# Patient Record
Sex: Male | Born: 1950 | Race: White | Hispanic: No | Marital: Single | Smoking: Never smoker
Health system: Southern US, Community
[De-identification: ages and names within clinical notes are randomized; demographics above are authoritative.]

## PROBLEM LIST (undated history)

## (undated) DIAGNOSIS — I10 Essential (primary) hypertension: Secondary | ICD-10-CM

## (undated) DIAGNOSIS — C801 Malignant (primary) neoplasm, unspecified: Secondary | ICD-10-CM

---

## 1978-01-14 HISTORY — PX: CHOLECYSTECTOMY: SHX55

## 1988-01-15 HISTORY — PX: HERNIA REPAIR: SHX51

## 2002-06-15 ENCOUNTER — Ambulatory Visit (HOSPITAL_COMMUNITY): Admission: RE | Admit: 2002-06-15 | Discharge: 2002-06-15 | Payer: Self-pay | Admitting: Gastroenterology

## 2007-11-17 ENCOUNTER — Encounter: Admission: RE | Admit: 2007-11-17 | Discharge: 2007-11-17 | Payer: Self-pay | Admitting: Family Medicine

## 2010-06-01 NOTE — Op Note (Signed)
   NAMEMORGEN, RITACCO                             ACCOUNT NO.:  [id number]   MEDICAL RECORD NO.:  [id number]                   PATIENT TYPE:  AMB   LOCATION:  ENDO                                 FACILITY:  [facility]   PHYSICIAN:  James L. Malon Kindle., M.D.          DATE OF BIRTH:  1950/08/17   DATE OF PROCEDURE:  06/15/2002  DATE OF DISCHARGE:                                 OPERATIVE REPORT   PROCEDURE PERFORMED:  Colonoscopy.   PREMEDICATION:  Fentanyl 50 mcg, Versed 5 mg IV.   SCOPE:  Olympus pediatric colonoscope.   INDICATIONS FOR PROCEDURE:  Colon cancer screening.   DESCRIPTION OF PROCEDURE:  The procedure had been explained to the patient  and consent obtained.  With the patient in the left lateral decubitus  position, the Olympus pediatric adjustable colonoscope was inserted and  advanced.  We advanced easily to the cecum.  The ileocecal valve and  appendiceal orifice were seen.  The scope was withdrawn in the cecum and  ascending colon, hepatic flexure, transverse and descending colon were seen.  The sigmoid colon was seen as well.  No polyps were seen throughout.  The  scope was withdrawn down into the rectum and the rectum was free of polyps.  The scope was withdrawn.  The patient tolerated the procedure well.  There  were no immediate problems.   ASSESSMENT:  Normal screening colonoscopy.   PLAN:  Will recommend yearly Hemoccults and possibly repeating the  colonoscopy in 10 years.                                                James L. Malon Kindle., M.D.    Waldron Session  D:  06/15/2002  T:  06/15/2002  Job:  [id number]   cc:   Schuyler Amor, M.D.  [address]  [place], [place] [address]  Fax: [phone]

## 2014-12-16 ENCOUNTER — Other Ambulatory Visit: Payer: Self-pay | Admitting: Family Medicine

## 2014-12-16 DIAGNOSIS — Z0000 Encounter for general adult medical examination without abnormal findings: Secondary | ICD-10-CM

## 2014-12-16 DIAGNOSIS — Z136 Encounter for screening for cardiovascular disorders: Secondary | ICD-10-CM

## 2015-01-13 ENCOUNTER — Other Ambulatory Visit: Payer: Self-pay | Admitting: Family Medicine

## 2015-01-13 ENCOUNTER — Ambulatory Visit
Admission: RE | Admit: 2015-01-13 | Discharge: 2015-01-13 | Disposition: A | Discharge status: Home / Self Care | Payer: BLUE CROSS/BLUE SHIELD | Source: Ambulatory Visit | Attending: Family Medicine | Admitting: Family Medicine

## 2015-01-13 DIAGNOSIS — Z136 Encounter for screening for cardiovascular disorders: Secondary | ICD-10-CM

## 2015-01-13 DIAGNOSIS — Z0000 Encounter for general adult medical examination without abnormal findings: Secondary | ICD-10-CM

## 2016-04-08 ENCOUNTER — Ambulatory Visit (INDEPENDENT_AMBULATORY_CARE_PROVIDER_SITE_OTHER): Payer: Worker's Compensation | Admitting: Family Medicine

## 2016-04-08 ENCOUNTER — Ambulatory Visit (INDEPENDENT_AMBULATORY_CARE_PROVIDER_SITE_OTHER): Payer: Worker's Compensation

## 2016-04-08 VITALS — BP 147/88 | HR 70 | Temp 97.9°F | Resp 18 | Ht 66.0 in | Wt 154.2 lb

## 2016-04-08 DIAGNOSIS — S0990XA Unspecified injury of head, initial encounter: Secondary | ICD-10-CM | POA: Diagnosis not present

## 2016-04-08 DIAGNOSIS — S0101XA Laceration without foreign body of scalp, initial encounter: Secondary | ICD-10-CM

## 2016-04-08 NOTE — Progress Notes (Signed)
Christian Oneal Nov 05, 1950 66 y.o.   Chief Complaint  Patient presents with  . Head Injury    X 2:30 today- WC    Presents for evaluation of work-related complaint.  Date of Injury: 04/08/2016  History of Present Illness: Patient reports that today at work he was replacing tools in the tool box The lid of the tool box was open and the wind blew and the lid of the tool fell down on his head He reports that it is a six foot long tool box with a heavy lid weighing about 25 pounds He denies any loss of consciousness He reports that it hit the top of his head closer to the left temple that created a scalp laceration He reports that he applied pressure to the area and it stopped bleeding He also hit his face on the right side     Review of Systems  Constitutional: Negative for chills, fever and weight loss.  HENT: Negative for hearing loss and tinnitus.   Eyes: Negative for blurred vision, double vision, photophobia and pain.  Respiratory: Negative for cough, shortness of breath and wheezing.   Cardiovascular: Negative for chest pain, palpitations and leg swelling.  Gastrointestinal: Negative for abdominal pain, blood in stool, constipation, diarrhea, nausea and vomiting.  Genitourinary: Negative for dysuria, frequency and urgency.  Neurological: Positive for headaches. Negative for dizziness, tingling, tremors, sensory change, speech change, focal weakness, seizures and loss of consciousness.  Psychiatric/Behavioral: Negative for depression and suicidal ideas. The patient is not nervous/anxious.        Current medications and allergies reviewed and updated. Past medical history, family history, social history have been reviewed and updated.   Physical Exam  Constitutional: He is oriented to person, place, and time and well-developed, well-nourished, and in no distress. No distress.  HENT:  Head: Normocephalic. Not macrocephalic and not microcephalic. Head is with contusion  and with laceration. Head is without raccoon's eyes, without Battle's sign, without abrasion, without right periorbital erythema and without left periorbital erythema.    Right Ear: External ear normal.  Left Ear: External ear normal.  Nose: Nose normal.  Mouth/Throat: Oropharynx is clear and moist. No oropharyngeal exudate.  Eyes: Conjunctivae and EOM are normal. Pupils are equal, round, and reactive to light. Right eye exhibits no discharge. Left eye exhibits no discharge. No scleral icterus.  Neck: Normal range of motion. Neck supple. No thyromegaly present.  Cardiovascular: Normal rate, regular rhythm and normal heart sounds.   No murmur heard. Pulmonary/Chest: Effort normal and breath sounds normal. No respiratory distress. He has no wheezes.  Abdominal: Soft. Bowel sounds are normal. He exhibits no distension and no mass. There is no tenderness. There is no rebound and no guarding.  Musculoskeletal: Normal range of motion. He exhibits no edema or tenderness.  Neurological: He is alert and oriented to person, place, and time. He has normal reflexes. He displays normal reflexes. No cranial nerve deficit. He exhibits normal muscle tone. Coordination normal. GCS score is 15.  Skin: He is not diaphoretic.  Psychiatric: Mood, memory, affect and judgment normal.    FINDINGS: No definite skull fractures identified. No obvious radiopaque foreign body. The facial bones are intact. The paranasal sinuses are clear. Incidental carotid artery calcifications.  IMPRESSION: No acute skull fracture or radiopaque foreign body.   Electronically Signed   By: Marijo Sanes M.D.   On: 04/08/2016 17:07   Assessment and Plan: Christian Oneal was seen today for work related head injury.  Diagnoses and all orders for this visit:  Laceration of scalp, initial encounter- laceration repaired with dermabond No skull fracture -     DG Skull Complete  Injury of head, initial encounter- given the nature of  the injury advised pt to stay home and observe for signs of concussion Advised tylenol for pain but no narcotics as narcotics can cause vomiting and this would delay diagnosis of intracranial injury He will return for re-evaluation in one week -     DG Skull Complete    A total of 45 minutes were spent face-to-face with the patient during this encounter and over half of that time was spent on counseling and coordination of care.

## 2016-04-08 NOTE — Patient Instructions (Addendum)
IF you received an x-ray today, you will receive an invoice from [facility]. Please contact [facility] at [phone] with questions or concerns regarding your invoice.   IF you received labwork today, you will receive an invoice from [place]. Please contact [facility] at [phone] with questions or concerns regarding your invoice.   Our billing staff will not be able to assist you with questions regarding bills from these companies.  You will be contacted with the lab results as soon as they are available. The fastest way to get your results is to activate your My Chart account. Instructions are located on the last page of this paperwork. If you have not heard from [place] regarding the results in 2 weeks, please contact this office.      Concussion, Adult A concussion is a brain injury from a direct hit (blow) to the head or body. This blow causes the brain to shake quickly back and forth inside the skull. This can damage brain cells and cause chemical changes in the brain. A concussion may also be known as a mild traumatic brain injury (TBI). Concussions are usually not life-threatening, but the effects of a concussion can be serious. If you have a concussion, you are more likely to experience concussion-like symptoms after a direct blow to the head in the future. What are the causes? This condition is caused by:  A direct blow to the head, such as from running into another player during a game, being hit in a fight, or hitting your head on a hard surface.  A jolt of the head or neck that causes the brain to move back and forth inside the skull, such as in a car crash. What are the signs or symptoms? The signs of a concussion can be hard to notice. Early on, they may be missed by you, family members, and health care providers. You may look fine but act or feel differently. Symptoms are usually temporary, but they may last for days, weeks, or even longer. Some  symptoms may appear right away but other symptoms may not show up for hours or days. Every head injury is different. Symptoms may include:  Headaches. This can include a feeling of pressure in the head.  Memory problems.  Trouble concentrating, organizing, or making decisions.  Slowness in thinking, acting or reacting, speaking, or reading.  Confusion.  Fatigue.  Changes in eating or sleeping patterns.  Problems with coordination or balance.  Nausea or vomiting.  Numbness or tingling.  Sensitivity to light or noise.  Vision or hearing problems.  Reduced sense of smell.  Irritability or mood changes.  Dizziness.  Lack of motivation.  Seeing or hearing things that other people do not see or hear (hallucinations). How is this diagnosed? This condition is diagnosed based on:  Your symptoms.  A description of your injury. You may also have tests, including:  Imaging tests, such as a CT scan or MRI. These are done to look for signs of brain injury.  Neuropsychological tests. These measure your thinking, understanding, learning, and remembering abilities. How is this treated? This condition is treated with physical and mental rest and careful observation, usually at home. If the concussion is severe, you may need to stay home from work for a while. You may be referred to a concussion clinic or to other health care providers for management. It is important that you tell your health care provider if:  You are taking any medicines, including prescription medicines,  over-the-counter medicines, and natural remedies. Some medicines, such as blood thinners (anticoagulants) and aspirin, may increase the chance of complications, such as bleeding.  You are taking or have taken alcohol or illegal drugs. Alcohol and certain other drugs may slow your recovery and can put you at risk of further injury. How fast you will recover from a concussion depends on many factors, such as how  severe your concussion is, what part of your brain was injured, how old you are, and how healthy you were before the concussion. Recovery can take time. It is important to wait to return to activity until a health care provider says it is safe to do that and your symptoms are completely gone. Follow these instructions at home: Activity   Limit activities that require a lot of thought or concentration. These may include:  Doing homework or job-related work.  Watching TV.  Working on the computer.  Playing memory games and puzzles.  Rest. Rest helps the brain to heal. Make sure you:  Get plenty of sleep at night. Avoid staying up late at night.  Keep the same bedtime hours on weekends and weekdays.  Rest during the day. Take naps or rest breaks when you feel tired.  Having another concussion before the first one has healed can be dangerous. Do not do high-risk activities that could cause a second concussion, such as riding a bicycle or playing sports.  Ask your health care provider when you can return to your normal activities, such as school, work, athletics, driving, riding a bicycle, or using heavy machinery. Your ability to react may be slower after a brain injury. Never do these activities if you are dizzy. Your health care provider will likely give you a plan for gradually returning to activities. General instructions   Take over-the-counter and prescription medicines only as told by your health care provider.  Do not drink alcohol until your health care provider says you can.  If it is harder than usual to remember things, write them down.  If you are easily distracted, try to do one thing at a time. For example, do not try to watch TV while fixing dinner.  Talk with family members or close friends when making important decisions.  Watch your symptoms and tell others to do the same. Complications sometimes occur after a concussion. Older adults with a brain injury may have a  higher risk of serious complications, such as a blood clot in the brain.  Tell your teachers, school nurse, school counselor, coach, athletic trainer, or work Freight forwarder about your injury, symptoms, and restrictions. Tell them about what you can or cannot do. They should watch for:  Increased problems with attention or concentration.  Increased difficulty remembering or learning new information.  Increased time needed to complete tasks or assignments.  Increased irritability or decreased ability to cope with stress.  Increased symptoms.  Keep all follow-up visits as told by your health care provider. This is important. How is this prevented? It is very important to avoid another brain injury, especially as you recover. In rare cases, another injury can lead to permanent brain damage, brain swelling, or death. The risk of this is greatest during the first 7-10 days after a head injury. Avoid injuries by:  Wearing a seat belt when riding in a car.  Wearing a helmet when biking, skiing, skateboarding, skating, or doing similar activities.  Avoiding activities that could lead to a second concussion, such as contact or recreational sports, until your  health care provider says it is okay.  Taking safety measures in your home, such as:  Removing clutter and tripping hazards from floors and stairways.  Using grab bars in bathrooms and handrails by stairs.  Placing non-slip mats on floors and in bathtubs.  Improving lighting in dim areas. Contact a health care provider if:  Your symptoms get worse.  You have new symptoms.  You continue to have symptoms for more than 2 weeks. Get help right away if:  You have severe or worsening headaches.  You have weakness or numbness in any part of your body.  Your coordination gets worse.  You vomit repeatedly.  You are sleepier.  The pupil of one eye is larger than the other.  You have convulsions or a seizure.  Your speech is  slurred.  Your fatigue, confusion, or irritability gets worse.  You cannot recognize people or places.  You have neck pain.  It is difficult to wake you up.  You have unusual behavior changes.  You lose consciousness. Summary  A concussion is a brain injury from a direct hit (blow) to the head or body.  A concussion may also be called a mild traumatic brain injury (TBI).  You may have imaging tests and neuropsychological tests to diagnose a concussion.  This condition is treated with physical and mental rest and careful observation.  Ask your health care provider when you can return to your normal activities, such as school, work, athletics, driving, riding a bicycle, or using heavy machinery. Follow safety instructions as told by your health care provider. This information is not intended to replace advice given to you by your health care provider. Make sure you discuss any questions you have with your health care provider. Document Released: 03/23/2003 Document Revised: 12/12/2015 Document Reviewed: 12/12/2015 Elsevier Interactive Patient Education  2017 [employer].  Tissue Adhesive Wound Care Some cuts, wounds, lacerations, and incisions can be repaired by using tissue adhesive, also called skin glue. It holds the skin together so healing can happen faster. It forms a strong bond on the skin in about 1 minute, and it reaches its full strength in about 2-3 minutes. The adhesive disappears naturally while the wound is healing. It is important to take proper care of your wound at home while it heals. Follow these instructions at home: Wound care   Showers are allowed after the first 24 hours. Do not soak the area where the tissue adhesive was placed. Do not take baths, swim, or use hot tubs. Do not use any soaps, petroleum jelly products, or ointments on the wound. Certain ointments can weaken the glue.  If a bandage (dressing) has been applied, keep it dry.  Follow  instructions from your health care provider about how often to change the dressing.  Wash your hands with soap and water before you change your dressing. If soap and water are not available, use hand sanitizer.  Change your dressing as told by your health care provider.  Leave tissue adhesive in place. It will fall off on its own after 7-10 days.  Do not scratch, rub, or pick at the adhesive.  Do not place tape over the adhesive. The adhesive could come off of the wound when you pull the tape off.  Protect the wound from further injury until it is healed.  Protect the wound from sun and tanning bed exposure while it is healing and for several weeks after healing. General instructions   Take over-the-counter and prescription medicines  only as told by your health care provider.  Keep all follow-up visits as told by your health care provider. This is important. Get help right away if:  Your wound reopens and is draining.  Your wound becomes red, swollen, hot, or tender.  You develop a rash after the glue is applied.  You have increasing pain in the wound.  You have a red streak going away from the wound.  You have pus coming from the wound.  You have increased bleeding.  You have a fever.  You have shaking chills.  You notice a bad smell coming from the wound.  Your wound or the adhesive breaks open. This information is not intended to replace advice given to you by your health care provider. Make sure you discuss any questions you have with your health care provider. Document Released: 06/26/2000 Document Revised: 11/24/2015 Document Reviewed: 11/24/2015 Elsevier Interactive Patient Education  2017 [employer].

## 2016-04-16 ENCOUNTER — Ambulatory Visit (INDEPENDENT_AMBULATORY_CARE_PROVIDER_SITE_OTHER): Payer: Worker's Compensation | Admitting: Physician Assistant

## 2016-04-16 VITALS — BP 142/90 | HR 60 | Temp 97.3°F | Resp 17 | Ht 65.5 in | Wt 157.0 lb

## 2016-04-16 DIAGNOSIS — S0101XD Laceration without foreign body of scalp, subsequent encounter: Secondary | ICD-10-CM | POA: Diagnosis not present

## 2016-04-16 DIAGNOSIS — R030 Elevated blood-pressure reading, without diagnosis of hypertension: Secondary | ICD-10-CM

## 2016-04-16 DIAGNOSIS — S0990XD Unspecified injury of head, subsequent encounter: Secondary | ICD-10-CM

## 2016-04-16 NOTE — Patient Instructions (Addendum)
Your physical exam today is reassuring. As long as you are symptom free, you are good to return to work. If anything starts to give you headache easily like light or noise, please let us know.   In terms of elevated blood pressure, I would like you to check your blood pressure at least a couple times over the next two weeks outside of the office and document these values. It is best if you check the blood pressure at different times in the day. Your goal is <140/90. If your values are consistently above this goal, please return to office for further evaluation. If you start to have chest pain, blurred vision, shortness of breath, severe headache, lower leg swelling, or nausea/vomiting please seek care immediately here or at the ED.    Post-Concussion Syndrome Post-concussion syndrome is the symptoms that can occur after a head injury. These symptoms can last from weeks to months. Follow these instructions at home:  Take medicines only as told by your doctor.  Do not take aspirin.  Sleep with your head raised to help with headaches.  Avoid activities that can cause another head injury.  Do not play contact sports like football, hockey, soccer, or basketball.  Do not do other risky activities like downhill skiing, martial arts, or horseback riding until your doctor says it is okay.  Keep all follow-up visits as told by your doctor. This is important. Contact a doctor if:  You have a harder time:  Paying attention.  Focusing.  Remembering.  Learning new information.  Dealing with stress.  You need more time to complete tasks.  You are easily bothered (irritable).  You have more symptoms. Get help if you have any of these symptoms for more than two weeks after your injury:  Long-lasting (chronic) headaches.  Dizziness.  Trouble balancing.  Feeling sick to your stomach (nauseous).  Trouble with your vision.  Noise or light bothers you more.  Depression.  Mood  swings.  Feeling worried (anxious).  Easily bothered.  Memory problems.  Trouble concentrating or paying attention.  Sleep problems.  Feeling tired all of the time. Get help right away if:  You feel confused.  You feel very sleepy.  You are hard to wake up.  You feel sick to your stomach.  You keep throwing up (vomiting).  You feel like you are moving when you are not (vertigo).  Your eyes move back and forth very quickly.  You start shaking (convulsing) or pass out (faint).  You have very bad headaches that do not get better with medicine.  You cannot use your arms or legs like normal.  One of the black centers of your eyes (pupils) is bigger than the other.  You have clear or bloody fluid coming from your nose or ears.  Your problems get worse, not better. This information is not intended to replace advice given to you by your health care provider. Make sure you discuss any questions you have with your health care provider. Document Released: 02/08/2004 Document Revised: 06/08/2015 Document Reviewed: 04/07/2013 Elsevier Interactive Patient Education  2017 [employer].    IF you received an x-ray today, you will receive an invoice from [facility]. Please contact [facility] at [phone] with questions or concerns regarding your invoice.   IF you received labwork today, you will receive an invoice from [place]. Please contact [facility] at [phone] with questions or concerns regarding your invoice.   Our billing staff will not be able to assist you  with questions regarding bills from these companies.  You will be contacted with the lab results as soon as they are available. The fastest way to get your results is to activate your My Chart account. Instructions are located on the last page of this paperwork. If you have not heard from [place] regarding the results in 2 weeks, please contact this office.

## 2016-04-16 NOTE — Progress Notes (Signed)
    MRN: [id number] DOB: 12/05/1950  Subjective:   Christian Oneal is a 66 y.o. male presenting for chief complaint of Follow-up and Concussion  Date of injury: 04/08/16.   Injury: Tool box lid, weighing about 25 pounds fell on top of patient's head causing laceration to scalp. Initial visit was on 04/08/16, plain films of skull negative. Dermabond applied to laceration. Instructed to return in one week for reevaluation.  Reports he is doing a lot better. Has not had any issues since his last visit. In terms of scalp laceration, he has been washing his hair gently over the past week. Denies any pain, purulent drainage, fever, redness or warmth. In terms of potential post concussion from head injury, he denies any headache, dizziness, lack of awareness of surroundings, nausea,  vomiting, mood and cognitive disturbances, sensitivity to light and noise, and sleep disturbances over the past week. He states he is ready to return to work now.   Review of Systems  Eyes: Negative for blurred vision and double vision.  Respiratory: Negative for shortness of breath.   Cardiovascular: Negative for chest pain and leg swelling.    Riaz's medications list, allergies, past medical history and past surgical history were reviewed and excluded from this note due to being a worker's comp case.   Objective:   Vitals: BP (!) 142/90   Pulse 60   Temp 97.3 F (36.3 C) (Oral)   Resp 17   Ht 5' 5.5" (1.664 m)   Wt 157 lb (71.2 kg)   SpO2 97%   BMI 25.73 kg/m   Physical Exam  Constitutional: He is oriented to person, place, and time. He appears well-developed and well-nourished.  HENT:  Head: Normocephalic and atraumatic.    Eyes: Conjunctivae and EOM are normal. Pupils are equal, round, and reactive to light.  Neck: Normal range of motion.  Cardiovascular: Normal rate, regular rhythm and normal heart sounds.   Pulmonary/Chest: Effort normal.  Neurological: He is alert and oriented to person, place, and  time. He has normal strength. No cranial nerve deficit. He displays a negative Romberg sign. Coordination and gait normal.  Skin: Skin is warm and dry.  Psychiatric: He has a normal mood and affect.  Vitals reviewed.    BP Readings from Last 3 Encounters:  04/16/16 (!) 142/90  04/08/16 (!) 147/88   No results found for this or any previous visit (from the past 24 hour(s)).  Assessment and Plan :  1. Laceration of scalp, subsequent encounter Well healing. Continue gentle hair washes.   2. Injury of head, subsequent encounter -Pt has not experienced any post concussion symptoms over the past week and has a reassuring PE today. I have released him to return to work with no restrictions. Discussed that if he returns to work and starts to experience any of the symptoms listed in his patient instruction handout to notify our office immediately.   3. Elevated BP without diagnosis of hypertension -Asymptomatic. Instructed to check BP outside of office over the next couple of weeks and if he is consistently getting readings >140/90, please return to office for further evaluation. Given strict ED precautions.   Tenna Delaine, PA-C  [facility] 04/16/2016 8:41 AM

## 2018-05-03 ENCOUNTER — Encounter: Payer: Self-pay | Admitting: Emergency Medicine

## 2018-05-03 ENCOUNTER — Emergency Department
Admission: EM | Admit: 2018-05-03 | Discharge: 2018-05-03 | Disposition: A | Discharge status: Home / Self Care | Payer: BLUE CROSS/BLUE SHIELD | Attending: Emergency Medicine | Admitting: Emergency Medicine

## 2018-05-03 ENCOUNTER — Emergency Department: Payer: BLUE CROSS/BLUE SHIELD

## 2018-05-03 DIAGNOSIS — Y9389 Activity, other specified: Secondary | ICD-10-CM | POA: Insufficient documentation

## 2018-05-03 DIAGNOSIS — Y92512 Supermarket, store or market as the place of occurrence of the external cause: Secondary | ICD-10-CM | POA: Insufficient documentation

## 2018-05-03 DIAGNOSIS — S0001XA Abrasion of scalp, initial encounter: Secondary | ICD-10-CM | POA: Insufficient documentation

## 2018-05-03 DIAGNOSIS — W0110XA Fall on same level from slipping, tripping and stumbling with subsequent striking against unspecified object, initial encounter: Secondary | ICD-10-CM | POA: Diagnosis not present

## 2018-05-03 DIAGNOSIS — Y999 Unspecified external cause status: Secondary | ICD-10-CM | POA: Diagnosis not present

## 2018-05-03 DIAGNOSIS — R55 Syncope and collapse: Secondary | ICD-10-CM

## 2018-05-03 DIAGNOSIS — T148XXA Other injury of unspecified body region, initial encounter: Secondary | ICD-10-CM

## 2018-05-03 LAB — CBC
HCT: 43.2 % (ref 39.0–52.0)
Hemoglobin: 14.9 g/dL (ref 13.0–17.0)
MCH: 31.6 pg (ref 26.0–34.0)
MCHC: 34.5 g/dL (ref 30.0–36.0)
MCV: 91.5 fL (ref 80.0–100.0)
Platelets: 208 10*3/uL (ref 150–400)
RBC: 4.72 MIL/uL (ref 4.22–5.81)
RDW: 12.6 % (ref 11.5–15.5)
WBC: 4.3 10*3/uL (ref 4.0–10.5)
nRBC: 0 % (ref 0.0–0.2)

## 2018-05-03 LAB — BASIC METABOLIC PANEL
Anion gap: 7 (ref 5–15)
BUN: 14 mg/dL (ref 8–23)
CO2: 24 mmol/L (ref 22–32)
Calcium: 9 mg/dL (ref 8.9–10.3)
Chloride: 107 mmol/L (ref 98–111)
Creatinine, Ser: 0.89 mg/dL (ref 0.61–1.24)
GFR calc Af Amer: >60 mL/min (ref >60)
GFR calc non Af Amer: >60 mL/min (ref >60)
Glucose, Bld: 128 mg/dL — ABNORMAL HIGH (ref 70–99)
Potassium: 4.3 mmol/L (ref 3.5–5.1)
Sodium: 138 mmol/L (ref 135–145)

## 2018-05-03 MED ORDER — SODIUM CHLORIDE 0.9 % IV BOLUS
500.0000 mL | Freq: Once | INTRAVENOUS | Status: AC
  Administered 2018-05-03: 500 mL via INTRAVENOUS

## 2018-05-03 NOTE — Discharge Instructions (Addendum)

## 2018-05-03 NOTE — ED Notes (Signed)
Patient transported to CT 

## 2018-05-03 NOTE — ED Provider Notes (Signed)
[facility] Emergency Department Provider Note   ____________________________________________   First MD Initiated Contact with Patient 05/03/18 1013     (approximate)  I have reviewed the triage vital signs and the nursing notes.   HISTORY  Chief Complaint Loss of Consciousness    HPI Christian Oneal is a 68 y.o. male who passed out in a grocery store   Patient reports this morning he got up and has been in normal health.  He is only been out of the house a couple times in the last couple weeks.  He was standing at the grocery store when he started to feel lightheaded.  He had been standing in line for about 5 to 10 minutes standing still, started to feel little nauseated and then got very lightheaded.  He reports he felt he was going out and woke up on the ground with the [employer] staff shaking him to wake him up.  He denies any injury except he reports there was a small amount of blood over the back of his scalp.  No vomiting.  No diarrhea.  No fevers or chills.  He has been in his normal health.  Reports he just had a couple coffee for breakfast and did not eat since about 630 last night.  Normally he would eat breakfast at about 9, but he was heading to the store today  There is been no chest pain.  No leg swelling.  No symptoms of illness.  He is not short of breath.  Reports he feels perfectly fine right now.  This tingling or weakness.  Denies headache except feeling little sore over the back of the scalp and does not wish for any medication for that   History reviewed. No pertinent past medical history. He reports no ongoing medical history, just a prior gallbladder removal There are no active problems to display for this patient. Currently takes no medications  Past Surgical History:  Procedure Laterality Date   [place]    Prior to Admission medications   Not on File    Allergies Codeine  Family History   Problem Relation Age of Onset   Hypertension Father    Alcohol abuse Father     Social History Social History   Tobacco Use   Smoking status: Never Smoker   Smokeless tobacco: Never Used  Substance Use Topics   Alcohol use: No   Drug use: No    Review of Systems Constitutional: No fever/chills Eyes: No visual changes except when the event occurred he felt like he was about to go out like a blackening of his vision briefly. ENT: No sore throat. Cardiovascular: Denies chest pain. Respiratory: Denies shortness of breath. Gastrointestinal: No abdominal pain.   Genitourinary: Negative for dysuria. Musculoskeletal: Negative for back pain. Skin: Negative for rash. Neurological: Negative for headaches separable soreness over the back of his scalp, areas of focal weakness or numbness.    ____________________________________________   PHYSICAL EXAM:  VITAL SIGNS: ED Triage Vitals  Enc Vitals Group     BP 05/03/18 1016 (!) 175/81     Pulse Rate 05/03/18 1016 (!) 53     Resp 05/03/18 1016 14     Temp 05/03/18 1016 97.8 F (36.6 C)     Temp Source 05/03/18 1016 Oral     SpO2 05/03/18 1013 96 %     Weight 05/03/18 1020 150 lb (68 kg)     Height 05/03/18 1020  5\' 6"  (1.676 m)     Head Circumference --      Peak Flow --      Pain Score 05/03/18 1018 1     Pain Loc --      Pain Edu? --      Excl. in [place]? --   He reports his tetanus shot is up-to-date and he gets it every 5 years.  Constitutional: Alert and oriented. Well appearing and in no acute distress. Eyes: Conjunctivae are normal. Head: Atraumatic except for a very small abrasion over the right posterior occipital region. Nose: No congestion/rhinnorhea. Mouth/Throat: Mucous membranes are moist. Neck: No stridor.  Cardiovascular: Normal rate, regular rhythm. Grossly normal heart sounds.  Good peripheral circulation. Respiratory: Normal respiratory effort.  No retractions. Lungs CTAB. Gastrointestinal: Soft and  nontender. No distention. Musculoskeletal: No lower extremity tenderness nor edema. Neurologic:  Normal speech and language. No gross focal neurologic deficits are appreciated.  Skin:  Skin is warm, dry and intact. No rash noted except as noted on head exam. Psychiatric: Mood and affect are normal. Speech and behavior are normal.  ____________________________________________   LABS (all labs ordered are listed, but only abnormal results are displayed)  Labs Reviewed  BASIC METABOLIC PANEL - Abnormal; Notable for the following components:      Result Value   Glucose, Bld 128 (*)    All other components within normal limits  CBC   ____________________________________________  EKG  ED ECG REPORT I, Delman Kitten, the attending physician, personally viewed and interpreted this ECG.  Date: 05/03/2018 EKG Time: 1020 Rate: 60 Rhythm: normal sinus rhythm QRS Axis: normal Intervals: normal ST/T Wave abnormalities: normal Narrative Interpretation: no evidence of acute ischemia.  Some artifact in V2, but appears normal  ____________________________________________  RADIOLOGY  Ct Head Wo Contrast  Result Date: 05/03/2018 CLINICAL DATA:  Syncope with head injury. EXAM: CT HEAD WITHOUT CONTRAST TECHNIQUE: Contiguous axial images were obtained from the base of the skull through the vertex without intravenous contrast. COMPARISON:  Skull radiographs 04/08/2016 FINDINGS: Brain: There is no evidence of acute infarct, intracranial hemorrhage, mass, midline shift, or extra-axial fluid collection. The ventricles and sulci are within normal limits for age. Cerebral white matter hypoattenuation is nonspecific though may reflect minimal chronic small vessel ischemic disease. Vascular: No hyperdense vessel. Skull: No fracture or suspicious osseous lesion. Sinuses/Orbits: Visualized paranasal sinuses and mastoid air cells are clear. Visualized orbits are unremarkable. Other: Mild posterior scalp swelling.  IMPRESSION: 1. No evidence of acute intracranial abnormality. 2. Mild posterior scalp swelling. Electronically Signed   By: Logan Bores M.D.   On: 05/03/2018 10:42     CT head negative for acute intracranial process ____________________________________________   PROCEDURES  Procedure(s) performed: None  Procedures  Critical Care performed: No  ____________________________________________   INITIAL IMPRESSION / ASSESSMENT AND PLAN / ED COURSE  Pertinent labs & imaging results that were available during my care of the patient were reviewed by me and considered in my medical decision making (see chart for details).   Patient describes a syncopal episode.  Likely exacerbated by not eating breakfast today and standing for about 5 to 10 minutes in place.  He has no chest pain or trouble breathing.  His neurologic exam is normal.  He appears well except for a contusion over the left posterior occiput.  There is no symptoms to suggest ACS with a very reassuring EKG.  We will evaluate further with lab work including CBC and a chemistry and continue to  observe him.  He is awake alert and asymptomatic at the time my evaluation.  We will also hydrate.  I do not see any red flags to suggest an immediate concern for cardiac arrhythmia or life-threatening etiology of his syncope at this time  Clinical Course as of May 02 1204  Sun May 03, 2018  1103 Patient passes all SF syncope rules.    [MQ]    Clinical Course User Index [MQ] Delman Kitten, MD    Lex Linhares was evaluated in Emergency Department on 05/03/2018 for the symptoms described in the history of present illness. He was evaluated in the context of the global COVID-19 pandemic, which necessitated consideration that the patient might be at risk for infection with the SARS-CoV-2 virus that causes COVID-19. Institutional protocols and algorithms that pertain to the evaluation of patients at risk for COVID-19 are in a state of rapid change based  on information released by regulatory bodies including the CDC and federal and state organizations. These policies and algorithms were followed during the patient's care in the ED.  The patient has no signs or symptoms to suggest coronavirus infection at this time.  ----------------------------------------- 12:05 PM on 05/03/2018 -----------------------------------------  Patient feels well.  Able to independently ambulate without difficulty.  Has eaten.  Appears appropriate for discharge.  Patient comfortable with plan for discharge and return precautions.  He is not driving himself home today  Return precautions and treatment recommendations and follow-up discussed with the patient who is agreeable with the plan.  ____________________________________________   FINAL CLINICAL IMPRESSION(S) / ED DIAGNOSES  Final diagnoses:  Syncope and collapse  Abrasion        Note:  This document was prepared using Dragon voice recognition software and may include unintentional dictation errors       Delman Kitten, MD 05/03/18 1206

## 2018-05-03 NOTE — ED Notes (Signed)
Pt ambulated without difficulty. Pt has no c/o of dizziness/lightheadedness. Pt states slight headache.

## 2018-05-03 NOTE — ED Notes (Signed)
Pt verbalized understanding of discharge instructions. NAD at this time. 

## 2018-05-03 NOTE — ED Triage Notes (Signed)
Pt to ED by EMS after LOC while shopping at [facility]. Pt hit head and has laceration to back of head. Pt has no other c/o of pain.

## 2018-05-03 NOTE — ED Notes (Signed)
Pt's head and laceration cleaned with NS and antiseptic scrub.

## 2018-05-21 ENCOUNTER — Other Ambulatory Visit: Payer: Self-pay | Admitting: *Deleted

## 2018-05-21 ENCOUNTER — Telehealth: Payer: Self-pay | Admitting: *Deleted

## 2018-05-21 DIAGNOSIS — R55 Syncope and collapse: Secondary | ICD-10-CM

## 2018-05-21 NOTE — Telephone Encounter (Signed)
[employer] to ship a 30 day cardiac event monitor to the patients home per Dr. Jonathon Jordan .  They will call today to confirm shipping address and then ship 2nd day air [employer].  Instructions for monitor use included in kit.  Contact [employer] at [phone] to send baseline recording.

## 2018-05-26 ENCOUNTER — Ambulatory Visit (INDEPENDENT_AMBULATORY_CARE_PROVIDER_SITE_OTHER): Payer: BLUE CROSS/BLUE SHIELD

## 2018-05-26 DIAGNOSIS — R55 Syncope and collapse: Secondary | ICD-10-CM

## 2018-07-01 ENCOUNTER — Other Ambulatory Visit: Payer: Self-pay

## 2019-01-25 DIAGNOSIS — J069 Acute upper respiratory infection, unspecified: Secondary | ICD-10-CM | POA: Diagnosis not present

## 2019-05-26 DIAGNOSIS — R001 Bradycardia, unspecified: Secondary | ICD-10-CM | POA: Diagnosis not present

## 2019-05-26 DIAGNOSIS — Z8616 Personal history of COVID-19: Secondary | ICD-10-CM | POA: Diagnosis not present

## 2019-05-26 DIAGNOSIS — E7800 Pure hypercholesterolemia, unspecified: Secondary | ICD-10-CM | POA: Diagnosis not present

## 2019-05-26 DIAGNOSIS — Z79899 Other long term (current) drug therapy: Secondary | ICD-10-CM | POA: Diagnosis not present

## 2019-05-26 DIAGNOSIS — R55 Syncope and collapse: Secondary | ICD-10-CM | POA: Diagnosis not present

## 2019-05-26 DIAGNOSIS — Z1159 Encounter for screening for other viral diseases: Secondary | ICD-10-CM | POA: Diagnosis not present

## 2019-05-26 DIAGNOSIS — Z1211 Encounter for screening for malignant neoplasm of colon: Secondary | ICD-10-CM | POA: Diagnosis not present

## 2019-05-26 DIAGNOSIS — Z0000 Encounter for general adult medical examination without abnormal findings: Secondary | ICD-10-CM | POA: Diagnosis not present

## 2019-05-26 DIAGNOSIS — N401 Enlarged prostate with lower urinary tract symptoms: Secondary | ICD-10-CM | POA: Diagnosis not present

## 2019-05-27 DIAGNOSIS — Z1211 Encounter for screening for malignant neoplasm of colon: Secondary | ICD-10-CM | POA: Diagnosis not present

## 2019-07-14 DIAGNOSIS — H2513 Age-related nuclear cataract, bilateral: Secondary | ICD-10-CM | POA: Diagnosis not present

## 2019-07-14 DIAGNOSIS — H20042 Secondary noninfectious iridocyclitis, left eye: Secondary | ICD-10-CM | POA: Diagnosis not present

## 2019-07-14 DIAGNOSIS — H25013 Cortical age-related cataract, bilateral: Secondary | ICD-10-CM | POA: Diagnosis not present

## 2019-07-21 DIAGNOSIS — H25013 Cortical age-related cataract, bilateral: Secondary | ICD-10-CM | POA: Diagnosis not present

## 2019-07-21 DIAGNOSIS — H2513 Age-related nuclear cataract, bilateral: Secondary | ICD-10-CM | POA: Diagnosis not present

## 2019-07-21 DIAGNOSIS — H20042 Secondary noninfectious iridocyclitis, left eye: Secondary | ICD-10-CM | POA: Diagnosis not present

## 2019-09-15 DIAGNOSIS — H524 Presbyopia: Secondary | ICD-10-CM | POA: Diagnosis not present

## 2020-03-09 DIAGNOSIS — H01021 Squamous blepharitis right upper eyelid: Secondary | ICD-10-CM | POA: Diagnosis not present

## 2020-03-09 DIAGNOSIS — H01024 Squamous blepharitis left upper eyelid: Secondary | ICD-10-CM | POA: Diagnosis not present

## 2020-03-09 DIAGNOSIS — H04123 Dry eye syndrome of bilateral lacrimal glands: Secondary | ICD-10-CM | POA: Diagnosis not present

## 2020-07-05 DIAGNOSIS — L989 Disorder of the skin and subcutaneous tissue, unspecified: Secondary | ICD-10-CM | POA: Diagnosis not present

## 2020-07-05 DIAGNOSIS — S30860A Insect bite (nonvenomous) of lower back and pelvis, initial encounter: Secondary | ICD-10-CM | POA: Diagnosis not present

## 2020-07-05 DIAGNOSIS — Z0000 Encounter for general adult medical examination without abnormal findings: Secondary | ICD-10-CM | POA: Diagnosis not present

## 2020-07-05 DIAGNOSIS — L719 Rosacea, unspecified: Secondary | ICD-10-CM | POA: Diagnosis not present

## 2020-07-05 DIAGNOSIS — I10 Essential (primary) hypertension: Secondary | ICD-10-CM | POA: Diagnosis not present

## 2020-07-05 DIAGNOSIS — E7800 Pure hypercholesterolemia, unspecified: Secondary | ICD-10-CM | POA: Diagnosis not present

## 2020-07-05 DIAGNOSIS — Z1211 Encounter for screening for malignant neoplasm of colon: Secondary | ICD-10-CM | POA: Diagnosis not present

## 2020-07-05 DIAGNOSIS — N401 Enlarged prostate with lower urinary tract symptoms: Secondary | ICD-10-CM | POA: Diagnosis not present

## 2020-07-07 DIAGNOSIS — L570 Actinic keratosis: Secondary | ICD-10-CM | POA: Diagnosis not present

## 2020-07-07 DIAGNOSIS — D0359 Melanoma in situ of other part of trunk: Secondary | ICD-10-CM | POA: Diagnosis not present

## 2020-07-07 DIAGNOSIS — L218 Other seborrheic dermatitis: Secondary | ICD-10-CM | POA: Diagnosis not present

## 2020-07-07 DIAGNOSIS — D485 Neoplasm of uncertain behavior of skin: Secondary | ICD-10-CM | POA: Diagnosis not present

## 2020-07-07 DIAGNOSIS — L989 Disorder of the skin and subcutaneous tissue, unspecified: Secondary | ICD-10-CM | POA: Diagnosis not present

## 2020-07-07 DIAGNOSIS — Z1211 Encounter for screening for malignant neoplasm of colon: Secondary | ICD-10-CM | POA: Diagnosis not present

## 2020-07-13 DIAGNOSIS — C4359 Malignant melanoma of other part of trunk: Secondary | ICD-10-CM | POA: Diagnosis not present

## 2020-08-08 DIAGNOSIS — C4359 Malignant melanoma of other part of trunk: Secondary | ICD-10-CM | POA: Diagnosis not present

## 2020-08-30 DIAGNOSIS — C4359 Malignant melanoma of other part of trunk: Secondary | ICD-10-CM | POA: Diagnosis not present

## 2020-09-11 DIAGNOSIS — C792 Secondary malignant neoplasm of skin: Secondary | ICD-10-CM | POA: Diagnosis not present

## 2020-09-11 DIAGNOSIS — C4359 Malignant melanoma of other part of trunk: Secondary | ICD-10-CM | POA: Diagnosis not present

## 2020-09-11 DIAGNOSIS — C773 Secondary and unspecified malignant neoplasm of axilla and upper limb lymph nodes: Secondary | ICD-10-CM | POA: Diagnosis not present

## 2020-09-11 DIAGNOSIS — L905 Scar conditions and fibrosis of skin: Secondary | ICD-10-CM | POA: Diagnosis not present

## 2020-09-11 DIAGNOSIS — C4362 Malignant melanoma of left upper limb, including shoulder: Secondary | ICD-10-CM | POA: Diagnosis not present

## 2020-09-11 DIAGNOSIS — D225 Melanocytic nevi of trunk: Secondary | ICD-10-CM | POA: Diagnosis not present

## 2020-09-11 DIAGNOSIS — D0362 Melanoma in situ of left upper limb, including shoulder: Secondary | ICD-10-CM | POA: Diagnosis not present

## 2020-09-26 DIAGNOSIS — C4359 Malignant melanoma of other part of trunk: Secondary | ICD-10-CM | POA: Diagnosis not present

## 2020-09-26 DIAGNOSIS — Z483 Aftercare following surgery for neoplasm: Secondary | ICD-10-CM | POA: Diagnosis not present

## 2020-09-28 DIAGNOSIS — Z9889 Other specified postprocedural states: Secondary | ICD-10-CM | POA: Diagnosis not present

## 2020-09-28 DIAGNOSIS — C4359 Malignant melanoma of other part of trunk: Secondary | ICD-10-CM | POA: Diagnosis not present

## 2020-10-06 DIAGNOSIS — D0362 Melanoma in situ of left upper limb, including shoulder: Secondary | ICD-10-CM | POA: Diagnosis not present

## 2020-10-06 DIAGNOSIS — C439 Malignant melanoma of skin, unspecified: Secondary | ICD-10-CM | POA: Diagnosis not present

## 2020-10-06 DIAGNOSIS — C779 Secondary and unspecified malignant neoplasm of lymph node, unspecified: Secondary | ICD-10-CM | POA: Diagnosis not present

## 2020-10-06 DIAGNOSIS — C4359 Malignant melanoma of other part of trunk: Secondary | ICD-10-CM | POA: Diagnosis not present

## 2020-10-11 DIAGNOSIS — C4359 Malignant melanoma of other part of trunk: Secondary | ICD-10-CM | POA: Diagnosis not present

## 2020-10-11 DIAGNOSIS — Z483 Aftercare following surgery for neoplasm: Secondary | ICD-10-CM | POA: Diagnosis not present

## 2020-11-09 IMAGING — CT CT HEAD WITHOUT CONTRAST
3 series · 16 of 46 positions shown, 19 images · non-contrast
Comparison: Skull radiographs 04/08/2016

CLINICAL DATA: Syncope with head injury.

EXAM:
CT HEAD WITHOUT CONTRAST
TECHNIQUE: Contiguous axial images were obtained from the base of the skull
through the vertex without intravenous contrast.

[Series 2: head wo · axial · 0.47mm/px · z∈[-155,-35]mm · 10 of 29 slices shown, 13 images]
[im 3/29  brain]
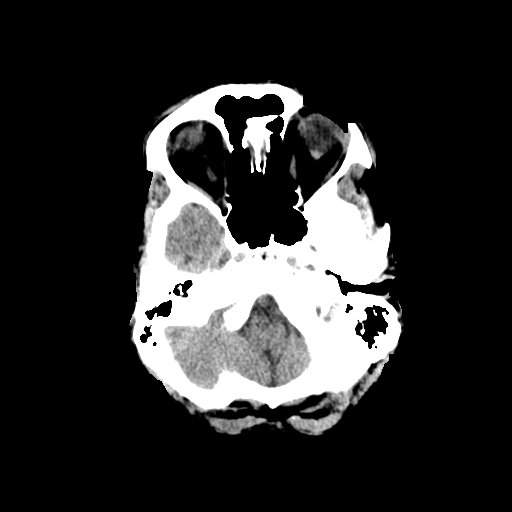
[im 3/29  bone]
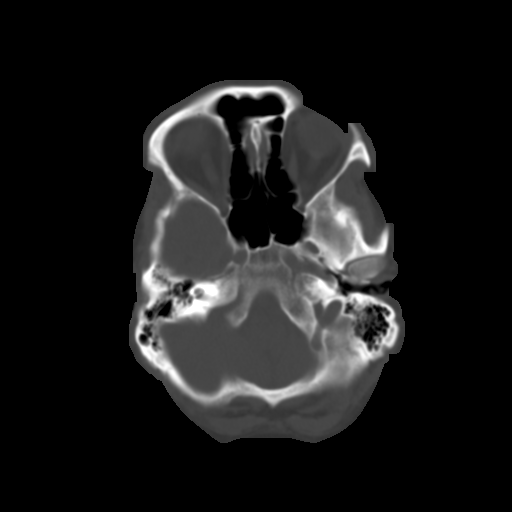
[im 6/29  brain]
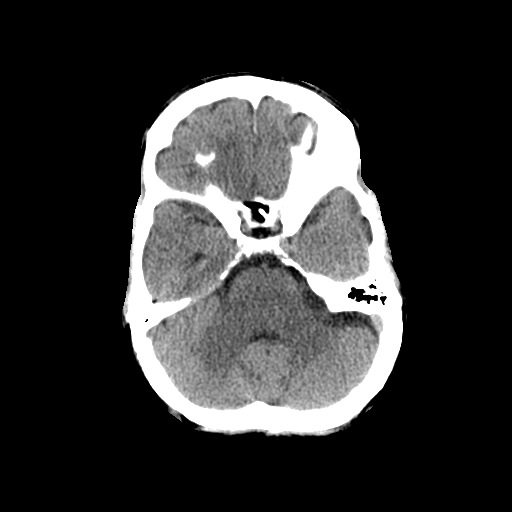
[im 8/29  brain]
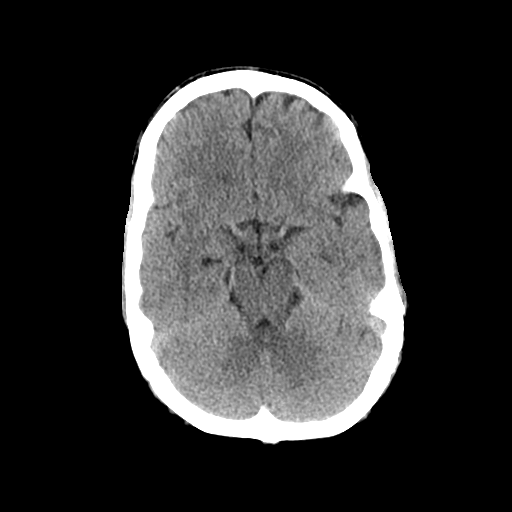
[im 11/29  brain]
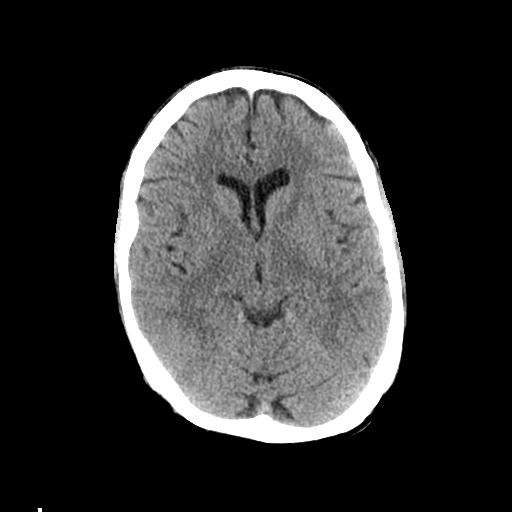
[im 14/29  brain]
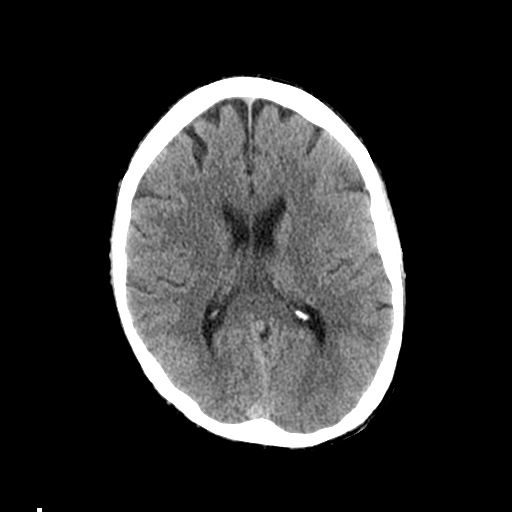
[im 14/29  bone]
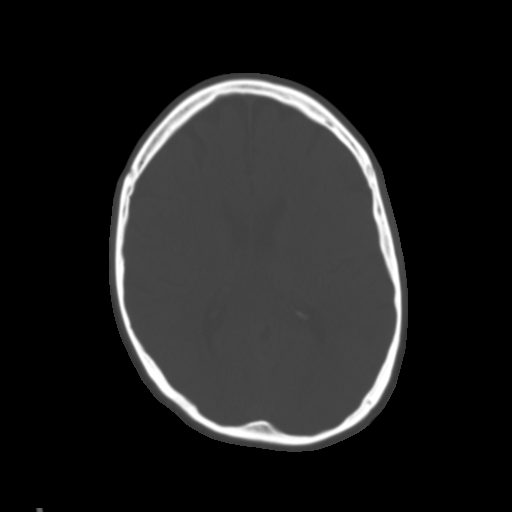
[im 16/29  brain]
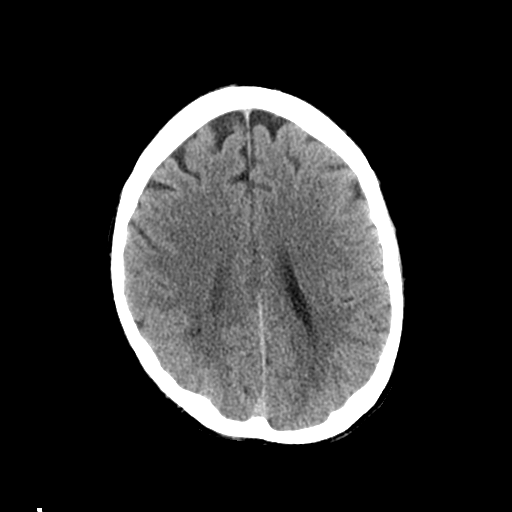
[im 19/29  brain]
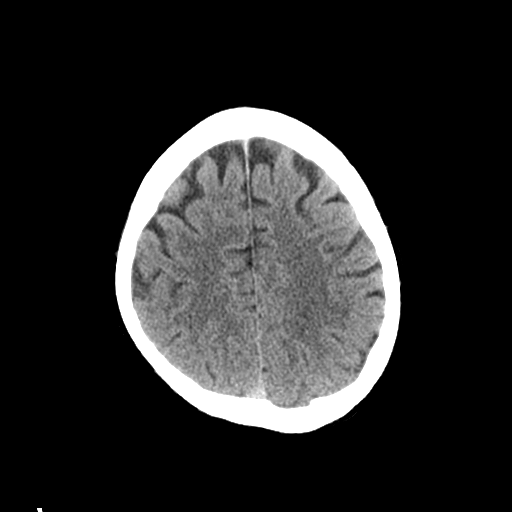
[im 22/29  brain]
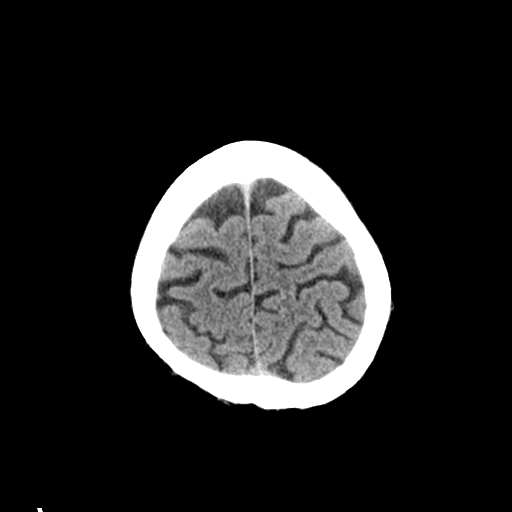
[im 24/29  brain]
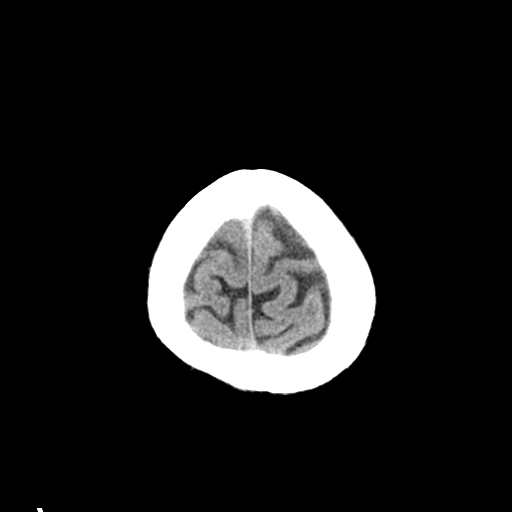
[im 24/29  bone]
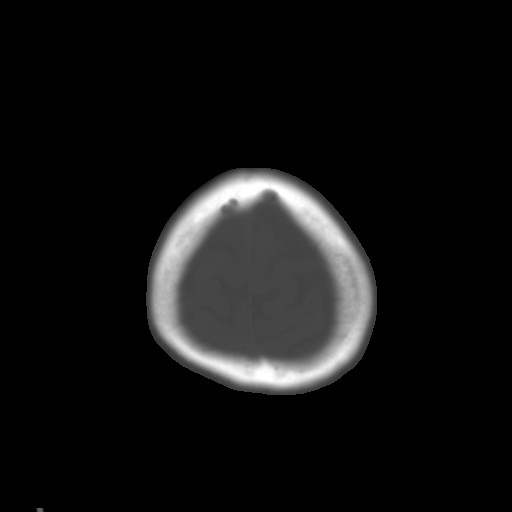
[im 27/29  brain]
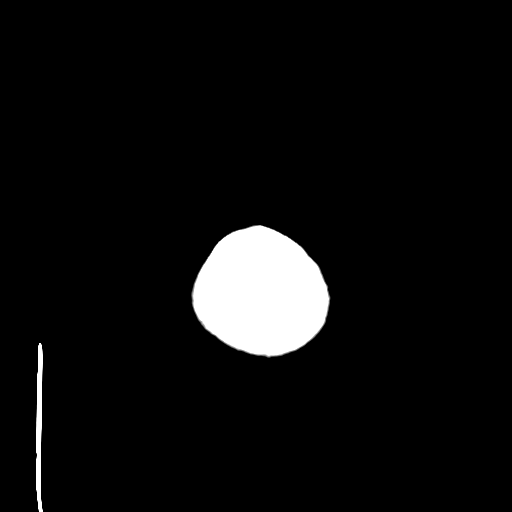

[Series 4: coronal soft tissue · coronal · 0.29mm/px · 3 of 65 slices shown]
[im 22/65  brain]
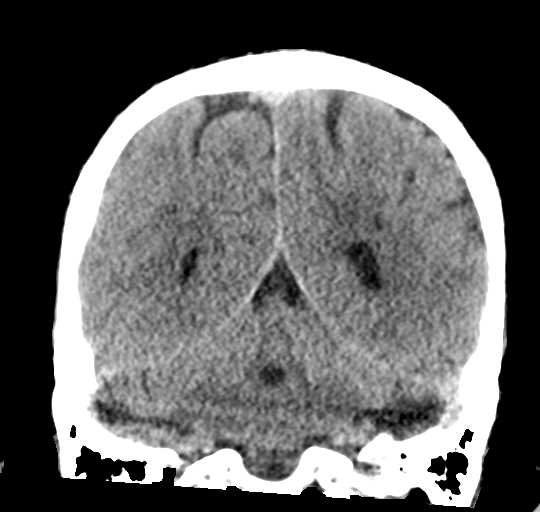
[im 29/65  brain]
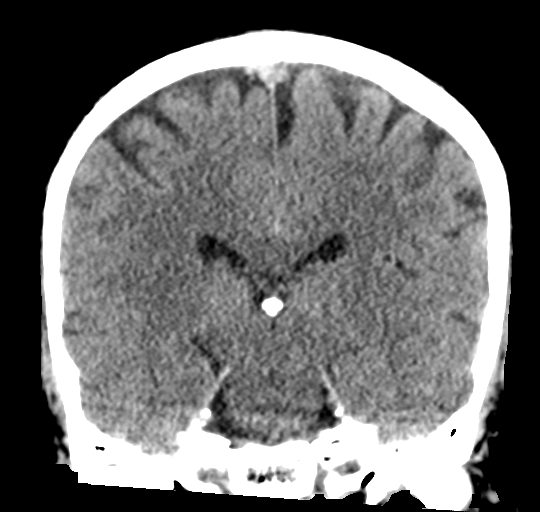
[im 36/65  brain]
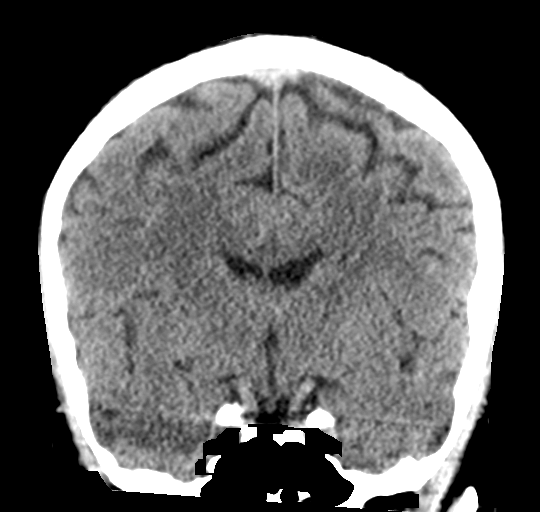

[Series 5: sagittal soft tissue · sagittal · 0.29mm/px · 3 of 53 slices shown]
[im 18/53  brain]
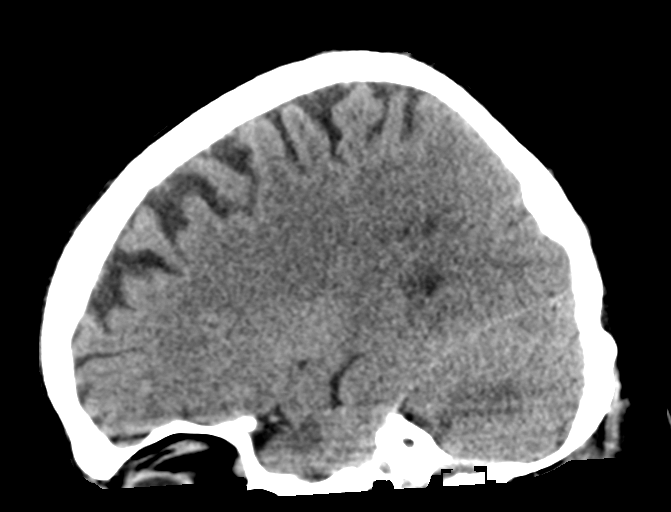
[im 27/53  brain]
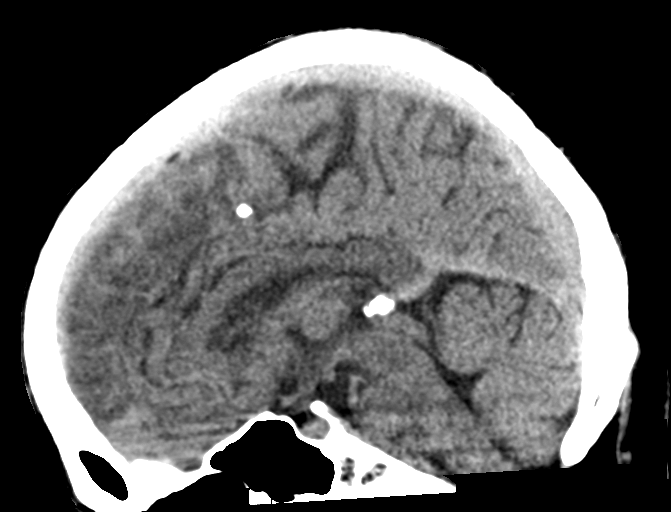
[im 35/53  brain]
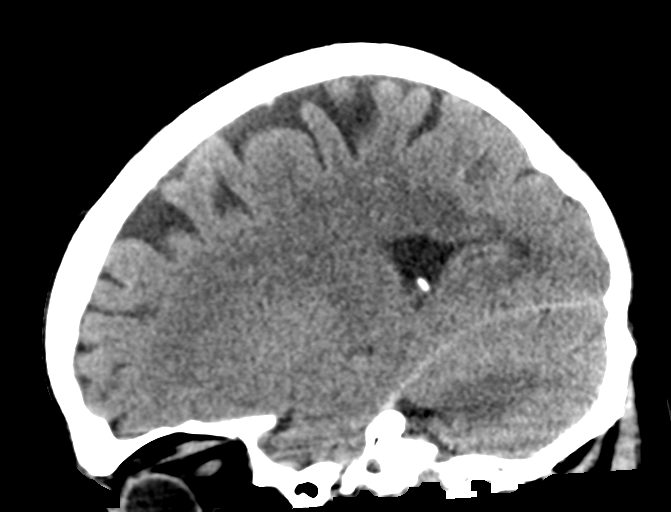

[16 of 46 positions shown; findings below may reference images not displayed]

FINDINGS: Brain: There is no evidence of acute infarct, intracranial
hemorrhage, mass, midline shift, or extra-axial fluid collection.
The ventricles and sulci are within normal limits for age. Cerebral
white matter hypoattenuation is nonspecific though may reflect
minimal chronic small vessel ischemic disease.

Vascular: No hyperdense vessel.

Skull: No fracture or suspicious osseous lesion.

Sinuses/Orbits: Visualized paranasal sinuses and mastoid air cells
are clear. Visualized orbits are unremarkable.

Other: Mild posterior scalp swelling.
IMPRESSION: 1. No evidence of acute intracranial abnormality.
2. Mild posterior scalp swelling.

## 2020-11-16 DIAGNOSIS — Z789 Other specified health status: Secondary | ICD-10-CM | POA: Diagnosis not present

## 2020-11-16 DIAGNOSIS — D485 Neoplasm of uncertain behavior of skin: Secondary | ICD-10-CM | POA: Diagnosis not present

## 2020-11-16 DIAGNOSIS — L820 Inflamed seborrheic keratosis: Secondary | ICD-10-CM | POA: Diagnosis not present

## 2020-11-16 DIAGNOSIS — Z808 Family history of malignant neoplasm of other organs or systems: Secondary | ICD-10-CM | POA: Diagnosis not present

## 2020-11-16 DIAGNOSIS — D2260 Melanocytic nevi of unspecified upper limb, including shoulder: Secondary | ICD-10-CM | POA: Diagnosis not present

## 2020-11-16 DIAGNOSIS — L568 Other specified acute skin changes due to ultraviolet radiation: Secondary | ICD-10-CM | POA: Diagnosis not present

## 2020-11-16 DIAGNOSIS — C44622 Squamous cell carcinoma of skin of right upper limb, including shoulder: Secondary | ICD-10-CM | POA: Diagnosis not present

## 2020-11-16 DIAGNOSIS — C773 Secondary and unspecified malignant neoplasm of axilla and upper limb lymph nodes: Secondary | ICD-10-CM | POA: Diagnosis not present

## 2020-11-27 DIAGNOSIS — D485 Neoplasm of uncertain behavior of skin: Secondary | ICD-10-CM | POA: Diagnosis not present

## 2020-11-28 DIAGNOSIS — D0362 Melanoma in situ of left upper limb, including shoulder: Secondary | ICD-10-CM | POA: Diagnosis not present

## 2020-11-28 DIAGNOSIS — D2362 Other benign neoplasm of skin of left upper limb, including shoulder: Secondary | ICD-10-CM | POA: Diagnosis not present

## 2020-11-28 DIAGNOSIS — C4359 Malignant melanoma of other part of trunk: Secondary | ICD-10-CM | POA: Diagnosis not present

## 2020-11-28 DIAGNOSIS — I10 Essential (primary) hypertension: Secondary | ICD-10-CM | POA: Diagnosis not present

## 2020-11-28 DIAGNOSIS — D099 Carcinoma in situ, unspecified: Secondary | ICD-10-CM | POA: Diagnosis not present

## 2020-11-28 DIAGNOSIS — D0461 Carcinoma in situ of skin of right upper limb, including shoulder: Secondary | ICD-10-CM | POA: Diagnosis not present

## 2020-11-30 DIAGNOSIS — R93422 Abnormal radiologic findings on diagnostic imaging of left kidney: Secondary | ICD-10-CM | POA: Diagnosis not present

## 2020-11-30 DIAGNOSIS — C439 Malignant melanoma of skin, unspecified: Secondary | ICD-10-CM | POA: Diagnosis not present

## 2020-11-30 DIAGNOSIS — R93421 Abnormal radiologic findings on diagnostic imaging of right kidney: Secondary | ICD-10-CM | POA: Diagnosis not present

## 2020-11-30 DIAGNOSIS — R933 Abnormal findings on diagnostic imaging of other parts of digestive tract: Secondary | ICD-10-CM | POA: Diagnosis not present

## 2020-12-11 DIAGNOSIS — C4359 Malignant melanoma of other part of trunk: Secondary | ICD-10-CM | POA: Diagnosis not present

## 2020-12-11 DIAGNOSIS — D485 Neoplasm of uncertain behavior of skin: Secondary | ICD-10-CM | POA: Diagnosis not present

## 2020-12-21 DIAGNOSIS — D485 Neoplasm of uncertain behavior of skin: Secondary | ICD-10-CM | POA: Diagnosis not present

## 2020-12-23 DIAGNOSIS — N281 Cyst of kidney, acquired: Secondary | ICD-10-CM | POA: Diagnosis not present

## 2020-12-23 DIAGNOSIS — C4359 Malignant melanoma of other part of trunk: Secondary | ICD-10-CM | POA: Diagnosis not present

## 2020-12-23 DIAGNOSIS — N2889 Other specified disorders of kidney and ureter: Secondary | ICD-10-CM | POA: Diagnosis not present

## 2020-12-25 DIAGNOSIS — Z79899 Other long term (current) drug therapy: Secondary | ICD-10-CM | POA: Diagnosis not present

## 2020-12-25 DIAGNOSIS — C4359 Malignant melanoma of other part of trunk: Secondary | ICD-10-CM | POA: Diagnosis not present

## 2021-01-03 DIAGNOSIS — I899 Noninfective disorder of lymphatic vessels and lymph nodes, unspecified: Secondary | ICD-10-CM | POA: Diagnosis not present

## 2021-01-03 DIAGNOSIS — C4359 Malignant melanoma of other part of trunk: Secondary | ICD-10-CM | POA: Diagnosis not present

## 2021-01-03 DIAGNOSIS — N281 Cyst of kidney, acquired: Secondary | ICD-10-CM | POA: Diagnosis not present

## 2021-01-22 DIAGNOSIS — D0462 Carcinoma in situ of skin of left upper limb, including shoulder: Secondary | ICD-10-CM | POA: Diagnosis not present

## 2021-01-22 DIAGNOSIS — D225 Melanocytic nevi of trunk: Secondary | ICD-10-CM | POA: Diagnosis not present

## 2021-01-22 DIAGNOSIS — D485 Neoplasm of uncertain behavior of skin: Secondary | ICD-10-CM | POA: Diagnosis not present

## 2021-01-22 DIAGNOSIS — Z23 Encounter for immunization: Secondary | ICD-10-CM | POA: Diagnosis not present

## 2021-01-22 DIAGNOSIS — L821 Other seborrheic keratosis: Secondary | ICD-10-CM | POA: Diagnosis not present

## 2021-02-28 DIAGNOSIS — L989 Disorder of the skin and subcutaneous tissue, unspecified: Secondary | ICD-10-CM | POA: Diagnosis not present

## 2021-02-28 DIAGNOSIS — D485 Neoplasm of uncertain behavior of skin: Secondary | ICD-10-CM | POA: Diagnosis not present

## 2021-02-28 DIAGNOSIS — D225 Melanocytic nevi of trunk: Secondary | ICD-10-CM | POA: Diagnosis not present

## 2021-05-17 DIAGNOSIS — Z85820 Personal history of malignant melanoma of skin: Secondary | ICD-10-CM | POA: Diagnosis not present

## 2021-05-17 DIAGNOSIS — L298 Other pruritus: Secondary | ICD-10-CM | POA: Diagnosis not present

## 2021-05-17 DIAGNOSIS — L814 Other melanin hyperpigmentation: Secondary | ICD-10-CM | POA: Diagnosis not present

## 2021-05-17 DIAGNOSIS — L538 Other specified erythematous conditions: Secondary | ICD-10-CM | POA: Diagnosis not present

## 2021-05-17 DIAGNOSIS — Z08 Encounter for follow-up examination after completed treatment for malignant neoplasm: Secondary | ICD-10-CM | POA: Diagnosis not present

## 2021-05-17 DIAGNOSIS — Z789 Other specified health status: Secondary | ICD-10-CM | POA: Diagnosis not present

## 2021-05-17 DIAGNOSIS — R208 Other disturbances of skin sensation: Secondary | ICD-10-CM | POA: Diagnosis not present

## 2021-05-17 DIAGNOSIS — L821 Other seborrheic keratosis: Secondary | ICD-10-CM | POA: Diagnosis not present

## 2021-05-17 DIAGNOSIS — L570 Actinic keratosis: Secondary | ICD-10-CM | POA: Diagnosis not present

## 2021-05-17 DIAGNOSIS — D1801 Hemangioma of skin and subcutaneous tissue: Secondary | ICD-10-CM | POA: Diagnosis not present

## 2021-05-17 DIAGNOSIS — L820 Inflamed seborrheic keratosis: Secondary | ICD-10-CM | POA: Diagnosis not present

## 2021-06-25 DIAGNOSIS — C4359 Malignant melanoma of other part of trunk: Secondary | ICD-10-CM | POA: Diagnosis not present

## 2021-06-29 DIAGNOSIS — C4359 Malignant melanoma of other part of trunk: Secondary | ICD-10-CM | POA: Diagnosis not present

## 2021-07-19 DIAGNOSIS — C439 Malignant melanoma of skin, unspecified: Secondary | ICD-10-CM | POA: Diagnosis not present

## 2021-07-19 DIAGNOSIS — Z0000 Encounter for general adult medical examination without abnormal findings: Secondary | ICD-10-CM | POA: Diagnosis not present

## 2021-07-19 DIAGNOSIS — I10 Essential (primary) hypertension: Secondary | ICD-10-CM | POA: Diagnosis not present

## 2021-07-19 DIAGNOSIS — Z1211 Encounter for screening for malignant neoplasm of colon: Secondary | ICD-10-CM | POA: Diagnosis not present

## 2021-07-19 DIAGNOSIS — N401 Enlarged prostate with lower urinary tract symptoms: Secondary | ICD-10-CM | POA: Diagnosis not present

## 2021-07-19 DIAGNOSIS — E7800 Pure hypercholesterolemia, unspecified: Secondary | ICD-10-CM | POA: Diagnosis not present

## 2021-08-06 DIAGNOSIS — H524 Presbyopia: Secondary | ICD-10-CM | POA: Diagnosis not present

## 2021-10-29 DIAGNOSIS — C4359 Malignant melanoma of other part of trunk: Secondary | ICD-10-CM | POA: Diagnosis not present

## 2021-11-12 DIAGNOSIS — Z85820 Personal history of malignant melanoma of skin: Secondary | ICD-10-CM | POA: Diagnosis not present

## 2021-11-12 DIAGNOSIS — D1801 Hemangioma of skin and subcutaneous tissue: Secondary | ICD-10-CM | POA: Diagnosis not present

## 2021-11-12 DIAGNOSIS — L821 Other seborrheic keratosis: Secondary | ICD-10-CM | POA: Diagnosis not present

## 2021-11-12 DIAGNOSIS — L718 Other rosacea: Secondary | ICD-10-CM | POA: Diagnosis not present

## 2021-11-12 DIAGNOSIS — Z08 Encounter for follow-up examination after completed treatment for malignant neoplasm: Secondary | ICD-10-CM | POA: Diagnosis not present

## 2021-11-12 DIAGNOSIS — L218 Other seborrheic dermatitis: Secondary | ICD-10-CM | POA: Diagnosis not present

## 2021-11-12 DIAGNOSIS — L814 Other melanin hyperpigmentation: Secondary | ICD-10-CM | POA: Diagnosis not present

## 2021-12-24 DIAGNOSIS — C4359 Malignant melanoma of other part of trunk: Secondary | ICD-10-CM | POA: Diagnosis not present

## 2021-12-25 DIAGNOSIS — I10 Essential (primary) hypertension: Secondary | ICD-10-CM | POA: Diagnosis not present

## 2021-12-25 DIAGNOSIS — C4359 Malignant melanoma of other part of trunk: Secondary | ICD-10-CM | POA: Diagnosis not present

## 2022-03-01 DIAGNOSIS — C4359 Malignant melanoma of other part of trunk: Secondary | ICD-10-CM | POA: Diagnosis not present

## 2022-03-01 DIAGNOSIS — D3101 Benign neoplasm of right conjunctiva: Secondary | ICD-10-CM | POA: Diagnosis not present

## 2022-03-07 DIAGNOSIS — D692 Other nonthrombocytopenic purpura: Secondary | ICD-10-CM | POA: Diagnosis not present

## 2022-03-15 DIAGNOSIS — D485 Neoplasm of uncertain behavior of skin: Secondary | ICD-10-CM | POA: Diagnosis not present

## 2022-03-15 DIAGNOSIS — H02132 Senile ectropion of right lower eyelid: Secondary | ICD-10-CM | POA: Diagnosis not present

## 2022-03-15 DIAGNOSIS — H0011 Chalazion right upper eyelid: Secondary | ICD-10-CM | POA: Diagnosis not present

## 2022-03-15 DIAGNOSIS — D4989 Neoplasm of unspecified behavior of other specified sites: Secondary | ICD-10-CM | POA: Diagnosis not present

## 2022-03-15 DIAGNOSIS — H04203 Unspecified epiphora, bilateral lacrimal glands: Secondary | ICD-10-CM | POA: Diagnosis not present

## 2022-03-15 DIAGNOSIS — L989 Disorder of the skin and subcutaneous tissue, unspecified: Secondary | ICD-10-CM | POA: Diagnosis not present

## 2022-05-20 DIAGNOSIS — L821 Other seborrheic keratosis: Secondary | ICD-10-CM | POA: Diagnosis not present

## 2022-05-20 DIAGNOSIS — H00011 Hordeolum externum right upper eyelid: Secondary | ICD-10-CM | POA: Diagnosis not present

## 2022-05-20 DIAGNOSIS — L814 Other melanin hyperpigmentation: Secondary | ICD-10-CM | POA: Diagnosis not present

## 2022-05-20 DIAGNOSIS — Z08 Encounter for follow-up examination after completed treatment for malignant neoplasm: Secondary | ICD-10-CM | POA: Diagnosis not present

## 2022-05-20 DIAGNOSIS — Z85820 Personal history of malignant melanoma of skin: Secondary | ICD-10-CM | POA: Diagnosis not present

## 2022-05-20 DIAGNOSIS — D225 Melanocytic nevi of trunk: Secondary | ICD-10-CM | POA: Diagnosis not present

## 2022-05-29 DIAGNOSIS — H02135 Senile ectropion of left lower eyelid: Secondary | ICD-10-CM | POA: Diagnosis not present

## 2022-05-29 DIAGNOSIS — H04203 Unspecified epiphora, bilateral lacrimal glands: Secondary | ICD-10-CM | POA: Diagnosis not present

## 2022-05-29 DIAGNOSIS — H04201 Unspecified epiphora, right lacrimal gland: Secondary | ICD-10-CM | POA: Diagnosis not present

## 2022-05-29 DIAGNOSIS — H04202 Unspecified epiphora, left lacrimal gland: Secondary | ICD-10-CM | POA: Diagnosis not present

## 2022-05-29 DIAGNOSIS — H0011 Chalazion right upper eyelid: Secondary | ICD-10-CM | POA: Diagnosis not present

## 2022-05-29 DIAGNOSIS — H02132 Senile ectropion of right lower eyelid: Secondary | ICD-10-CM | POA: Diagnosis not present

## 2022-06-18 DIAGNOSIS — D485 Neoplasm of uncertain behavior of skin: Secondary | ICD-10-CM | POA: Diagnosis not present

## 2022-06-18 DIAGNOSIS — D22111 Melanocytic nevi of right upper eyelid, including canthus: Secondary | ICD-10-CM | POA: Diagnosis not present

## 2022-06-25 DIAGNOSIS — C4359 Malignant melanoma of other part of trunk: Secondary | ICD-10-CM | POA: Diagnosis not present

## 2022-06-25 DIAGNOSIS — R030 Elevated blood-pressure reading, without diagnosis of hypertension: Secondary | ICD-10-CM | POA: Diagnosis not present

## 2022-07-04 DIAGNOSIS — R222 Localized swelling, mass and lump, trunk: Secondary | ICD-10-CM | POA: Diagnosis not present

## 2022-07-04 DIAGNOSIS — C4359 Malignant melanoma of other part of trunk: Secondary | ICD-10-CM | POA: Diagnosis not present

## 2022-07-09 DIAGNOSIS — E063 Autoimmune thyroiditis: Secondary | ICD-10-CM | POA: Diagnosis not present

## 2022-07-09 DIAGNOSIS — Z1159 Encounter for screening for other viral diseases: Secondary | ICD-10-CM | POA: Diagnosis not present

## 2022-07-09 DIAGNOSIS — C4359 Malignant melanoma of other part of trunk: Secondary | ICD-10-CM | POA: Diagnosis not present

## 2022-07-16 DIAGNOSIS — D239 Other benign neoplasm of skin, unspecified: Secondary | ICD-10-CM | POA: Diagnosis not present

## 2022-07-19 DIAGNOSIS — C4359 Malignant melanoma of other part of trunk: Secondary | ICD-10-CM | POA: Diagnosis not present

## 2022-07-30 DIAGNOSIS — C4359 Malignant melanoma of other part of trunk: Secondary | ICD-10-CM | POA: Diagnosis not present

## 2022-08-01 DIAGNOSIS — C4359 Malignant melanoma of other part of trunk: Secondary | ICD-10-CM | POA: Diagnosis not present

## 2022-08-01 DIAGNOSIS — Z79899 Other long term (current) drug therapy: Secondary | ICD-10-CM | POA: Diagnosis not present

## 2022-08-20 DIAGNOSIS — C439 Malignant melanoma of skin, unspecified: Secondary | ICD-10-CM | POA: Diagnosis not present

## 2022-08-20 DIAGNOSIS — Z13220 Encounter for screening for lipoid disorders: Secondary | ICD-10-CM | POA: Diagnosis not present

## 2022-08-20 DIAGNOSIS — N401 Enlarged prostate with lower urinary tract symptoms: Secondary | ICD-10-CM | POA: Diagnosis not present

## 2022-08-20 DIAGNOSIS — Z125 Encounter for screening for malignant neoplasm of prostate: Secondary | ICD-10-CM | POA: Diagnosis not present

## 2022-08-20 DIAGNOSIS — Z0000 Encounter for general adult medical examination without abnormal findings: Secondary | ICD-10-CM | POA: Diagnosis not present

## 2022-08-20 DIAGNOSIS — Z79899 Other long term (current) drug therapy: Secondary | ICD-10-CM | POA: Diagnosis not present

## 2022-08-20 DIAGNOSIS — Z1211 Encounter for screening for malignant neoplasm of colon: Secondary | ICD-10-CM | POA: Diagnosis not present

## 2022-08-20 DIAGNOSIS — I10 Essential (primary) hypertension: Secondary | ICD-10-CM | POA: Diagnosis not present

## 2022-08-21 DIAGNOSIS — Z1211 Encounter for screening for malignant neoplasm of colon: Secondary | ICD-10-CM | POA: Diagnosis not present

## 2022-08-23 DIAGNOSIS — C4359 Malignant melanoma of other part of trunk: Secondary | ICD-10-CM | POA: Diagnosis not present

## 2022-08-23 DIAGNOSIS — Z79899 Other long term (current) drug therapy: Secondary | ICD-10-CM | POA: Diagnosis not present

## 2022-09-05 DIAGNOSIS — C4359 Malignant melanoma of other part of trunk: Secondary | ICD-10-CM | POA: Diagnosis not present

## 2022-09-19 DIAGNOSIS — Z79899 Other long term (current) drug therapy: Secondary | ICD-10-CM | POA: Diagnosis not present

## 2022-09-19 DIAGNOSIS — Z5112 Encounter for antineoplastic immunotherapy: Secondary | ICD-10-CM | POA: Diagnosis not present

## 2022-09-19 DIAGNOSIS — C4359 Malignant melanoma of other part of trunk: Secondary | ICD-10-CM | POA: Diagnosis not present

## 2022-10-10 DIAGNOSIS — Z5112 Encounter for antineoplastic immunotherapy: Secondary | ICD-10-CM | POA: Diagnosis not present

## 2022-10-10 DIAGNOSIS — C4359 Malignant melanoma of other part of trunk: Secondary | ICD-10-CM | POA: Diagnosis not present

## 2022-10-24 DIAGNOSIS — C4359 Malignant melanoma of other part of trunk: Secondary | ICD-10-CM | POA: Diagnosis not present

## 2022-10-24 DIAGNOSIS — Z5112 Encounter for antineoplastic immunotherapy: Secondary | ICD-10-CM | POA: Diagnosis not present

## 2022-11-01 DIAGNOSIS — C4359 Malignant melanoma of other part of trunk: Secondary | ICD-10-CM | POA: Diagnosis not present

## 2022-11-01 DIAGNOSIS — R911 Solitary pulmonary nodule: Secondary | ICD-10-CM | POA: Diagnosis not present

## 2022-11-07 DIAGNOSIS — C4359 Malignant melanoma of other part of trunk: Secondary | ICD-10-CM | POA: Diagnosis not present

## 2022-11-11 DIAGNOSIS — R21 Rash and other nonspecific skin eruption: Secondary | ICD-10-CM | POA: Diagnosis not present

## 2022-11-11 DIAGNOSIS — C4359 Malignant melanoma of other part of trunk: Secondary | ICD-10-CM | POA: Diagnosis not present

## 2022-11-11 DIAGNOSIS — E063 Autoimmune thyroiditis: Secondary | ICD-10-CM | POA: Diagnosis not present

## 2022-11-20 DIAGNOSIS — Z08 Encounter for follow-up examination after completed treatment for malignant neoplasm: Secondary | ICD-10-CM | POA: Diagnosis not present

## 2022-11-20 DIAGNOSIS — D225 Melanocytic nevi of trunk: Secondary | ICD-10-CM | POA: Diagnosis not present

## 2022-11-20 DIAGNOSIS — Z85820 Personal history of malignant melanoma of skin: Secondary | ICD-10-CM | POA: Diagnosis not present

## 2022-11-20 DIAGNOSIS — L309 Dermatitis, unspecified: Secondary | ICD-10-CM | POA: Diagnosis not present

## 2022-11-20 DIAGNOSIS — L821 Other seborrheic keratosis: Secondary | ICD-10-CM | POA: Diagnosis not present

## 2022-11-20 DIAGNOSIS — Z4802 Encounter for removal of sutures: Secondary | ICD-10-CM | POA: Diagnosis not present

## 2022-11-20 DIAGNOSIS — L814 Other melanin hyperpigmentation: Secondary | ICD-10-CM | POA: Diagnosis not present

## 2022-11-20 DIAGNOSIS — Z872 Personal history of diseases of the skin and subcutaneous tissue: Secondary | ICD-10-CM | POA: Diagnosis not present

## 2022-11-20 DIAGNOSIS — Z85828 Personal history of other malignant neoplasm of skin: Secondary | ICD-10-CM | POA: Diagnosis not present

## 2022-11-20 DIAGNOSIS — Z09 Encounter for follow-up examination after completed treatment for conditions other than malignant neoplasm: Secondary | ICD-10-CM | POA: Diagnosis not present

## 2022-11-29 DIAGNOSIS — C4359 Malignant melanoma of other part of trunk: Secondary | ICD-10-CM | POA: Diagnosis not present

## 2022-12-24 DIAGNOSIS — B354 Tinea corporis: Secondary | ICD-10-CM | POA: Diagnosis not present

## 2023-01-14 DIAGNOSIS — B354 Tinea corporis: Secondary | ICD-10-CM | POA: Diagnosis not present

## 2023-02-04 DIAGNOSIS — C4359 Malignant melanoma of other part of trunk: Secondary | ICD-10-CM | POA: Diagnosis not present

## 2023-02-04 DIAGNOSIS — R911 Solitary pulmonary nodule: Secondary | ICD-10-CM | POA: Diagnosis not present

## 2023-03-13 DIAGNOSIS — I10 Essential (primary) hypertension: Secondary | ICD-10-CM | POA: Diagnosis not present

## 2023-03-14 DIAGNOSIS — B354 Tinea corporis: Secondary | ICD-10-CM | POA: Diagnosis not present

## 2023-03-20 DIAGNOSIS — Z79899 Other long term (current) drug therapy: Secondary | ICD-10-CM | POA: Diagnosis not present

## 2023-03-25 DIAGNOSIS — C4359 Malignant melanoma of other part of trunk: Secondary | ICD-10-CM | POA: Diagnosis not present

## 2023-04-01 DIAGNOSIS — C4359 Malignant melanoma of other part of trunk: Secondary | ICD-10-CM | POA: Diagnosis not present

## 2023-04-15 DIAGNOSIS — C4359 Malignant melanoma of other part of trunk: Secondary | ICD-10-CM | POA: Diagnosis not present

## 2023-04-28 DIAGNOSIS — B354 Tinea corporis: Secondary | ICD-10-CM | POA: Diagnosis not present

## 2023-04-28 DIAGNOSIS — L570 Actinic keratosis: Secondary | ICD-10-CM | POA: Diagnosis not present

## 2023-05-06 DIAGNOSIS — C7801 Secondary malignant neoplasm of right lung: Secondary | ICD-10-CM | POA: Diagnosis not present

## 2023-05-06 DIAGNOSIS — C4359 Malignant melanoma of other part of trunk: Secondary | ICD-10-CM | POA: Diagnosis not present

## 2023-05-06 DIAGNOSIS — B354 Tinea corporis: Secondary | ICD-10-CM | POA: Diagnosis not present

## 2023-05-06 DIAGNOSIS — Z79899 Other long term (current) drug therapy: Secondary | ICD-10-CM | POA: Diagnosis not present

## 2023-05-20 DIAGNOSIS — R0602 Shortness of breath: Secondary | ICD-10-CM | POA: Diagnosis not present

## 2023-05-20 DIAGNOSIS — T50905A Adverse effect of unspecified drugs, medicaments and biological substances, initial encounter: Secondary | ICD-10-CM | POA: Diagnosis not present

## 2023-05-20 DIAGNOSIS — Z885 Allergy status to narcotic agent status: Secondary | ICD-10-CM | POA: Diagnosis not present

## 2023-05-20 DIAGNOSIS — Z7969 Long term (current) use of other immunomodulators and immunosuppressants: Secondary | ICD-10-CM | POA: Diagnosis not present

## 2023-05-20 DIAGNOSIS — R9431 Abnormal electrocardiogram [ECG] [EKG]: Secondary | ICD-10-CM | POA: Diagnosis not present

## 2023-05-20 DIAGNOSIS — Z5112 Encounter for antineoplastic immunotherapy: Secondary | ICD-10-CM | POA: Diagnosis not present

## 2023-05-20 DIAGNOSIS — R079 Chest pain, unspecified: Secondary | ICD-10-CM | POA: Diagnosis not present

## 2023-05-20 DIAGNOSIS — R232 Flushing: Secondary | ICD-10-CM | POA: Diagnosis not present

## 2023-05-20 DIAGNOSIS — C7800 Secondary malignant neoplasm of unspecified lung: Secondary | ICD-10-CM | POA: Diagnosis not present

## 2023-05-20 DIAGNOSIS — Z79899 Other long term (current) drug therapy: Secondary | ICD-10-CM | POA: Diagnosis not present

## 2023-05-20 DIAGNOSIS — C7801 Secondary malignant neoplasm of right lung: Secondary | ICD-10-CM | POA: Diagnosis not present

## 2023-05-20 DIAGNOSIS — R001 Bradycardia, unspecified: Secondary | ICD-10-CM | POA: Diagnosis not present

## 2023-05-20 DIAGNOSIS — C4359 Malignant melanoma of other part of trunk: Secondary | ICD-10-CM | POA: Diagnosis not present

## 2023-05-20 DIAGNOSIS — T451X5A Adverse effect of antineoplastic and immunosuppressive drugs, initial encounter: Secondary | ICD-10-CM | POA: Diagnosis not present

## 2023-05-28 DIAGNOSIS — D2271 Melanocytic nevi of right lower limb, including hip: Secondary | ICD-10-CM | POA: Diagnosis not present

## 2023-05-28 DIAGNOSIS — Z85820 Personal history of malignant melanoma of skin: Secondary | ICD-10-CM | POA: Diagnosis not present

## 2023-05-28 DIAGNOSIS — D225 Melanocytic nevi of trunk: Secondary | ICD-10-CM | POA: Diagnosis not present

## 2023-05-28 DIAGNOSIS — L814 Other melanin hyperpigmentation: Secondary | ICD-10-CM | POA: Diagnosis not present

## 2023-05-28 DIAGNOSIS — D485 Neoplasm of uncertain behavior of skin: Secondary | ICD-10-CM | POA: Diagnosis not present

## 2023-05-28 DIAGNOSIS — B354 Tinea corporis: Secondary | ICD-10-CM | POA: Diagnosis not present

## 2023-05-28 DIAGNOSIS — Z08 Encounter for follow-up examination after completed treatment for malignant neoplasm: Secondary | ICD-10-CM | POA: Diagnosis not present

## 2023-05-28 DIAGNOSIS — Z872 Personal history of diseases of the skin and subcutaneous tissue: Secondary | ICD-10-CM | POA: Diagnosis not present

## 2023-05-28 DIAGNOSIS — L821 Other seborrheic keratosis: Secondary | ICD-10-CM | POA: Diagnosis not present

## 2023-05-28 DIAGNOSIS — Z85828 Personal history of other malignant neoplasm of skin: Secondary | ICD-10-CM | POA: Diagnosis not present

## 2023-05-28 DIAGNOSIS — Z09 Encounter for follow-up examination after completed treatment for conditions other than malignant neoplasm: Secondary | ICD-10-CM | POA: Diagnosis not present

## 2023-06-17 DIAGNOSIS — Z5112 Encounter for antineoplastic immunotherapy: Secondary | ICD-10-CM | POA: Diagnosis not present

## 2023-06-17 DIAGNOSIS — Z79899 Other long term (current) drug therapy: Secondary | ICD-10-CM | POA: Diagnosis not present

## 2023-06-17 DIAGNOSIS — C4359 Malignant melanoma of other part of trunk: Secondary | ICD-10-CM | POA: Diagnosis not present

## 2023-06-17 DIAGNOSIS — C7801 Secondary malignant neoplasm of right lung: Secondary | ICD-10-CM | POA: Diagnosis not present

## 2023-07-15 DIAGNOSIS — Z79899 Other long term (current) drug therapy: Secondary | ICD-10-CM | POA: Diagnosis not present

## 2023-07-15 DIAGNOSIS — G8929 Other chronic pain: Secondary | ICD-10-CM | POA: Diagnosis not present

## 2023-07-15 DIAGNOSIS — T8089XA Other complications following infusion, transfusion and therapeutic injection, initial encounter: Secondary | ICD-10-CM | POA: Diagnosis not present

## 2023-07-15 DIAGNOSIS — Z5112 Encounter for antineoplastic immunotherapy: Secondary | ICD-10-CM | POA: Diagnosis not present

## 2023-07-15 DIAGNOSIS — M549 Dorsalgia, unspecified: Secondary | ICD-10-CM | POA: Diagnosis not present

## 2023-07-15 DIAGNOSIS — R5383 Other fatigue: Secondary | ICD-10-CM | POA: Diagnosis not present

## 2023-07-15 DIAGNOSIS — C7801 Secondary malignant neoplasm of right lung: Secondary | ICD-10-CM | POA: Diagnosis not present

## 2023-07-15 DIAGNOSIS — C4359 Malignant melanoma of other part of trunk: Secondary | ICD-10-CM | POA: Diagnosis not present

## 2023-07-22 DIAGNOSIS — H524 Presbyopia: Secondary | ICD-10-CM | POA: Diagnosis not present

## 2023-08-05 DIAGNOSIS — Z79899 Other long term (current) drug therapy: Secondary | ICD-10-CM | POA: Diagnosis not present

## 2023-08-05 DIAGNOSIS — J338 Other polyp of sinus: Secondary | ICD-10-CM | POA: Diagnosis not present

## 2023-08-05 DIAGNOSIS — C7800 Secondary malignant neoplasm of unspecified lung: Secondary | ICD-10-CM | POA: Diagnosis not present

## 2023-08-05 DIAGNOSIS — R918 Other nonspecific abnormal finding of lung field: Secondary | ICD-10-CM | POA: Diagnosis not present

## 2023-08-05 DIAGNOSIS — C4359 Malignant melanoma of other part of trunk: Secondary | ICD-10-CM | POA: Diagnosis not present

## 2023-08-05 DIAGNOSIS — C7801 Secondary malignant neoplasm of right lung: Secondary | ICD-10-CM | POA: Diagnosis not present

## 2023-08-05 DIAGNOSIS — R911 Solitary pulmonary nodule: Secondary | ICD-10-CM | POA: Diagnosis not present

## 2023-08-05 DIAGNOSIS — M6289 Other specified disorders of muscle: Secondary | ICD-10-CM | POA: Diagnosis not present

## 2023-08-05 DIAGNOSIS — R590 Localized enlarged lymph nodes: Secondary | ICD-10-CM | POA: Diagnosis not present

## 2023-08-05 DIAGNOSIS — C7989 Secondary malignant neoplasm of other specified sites: Secondary | ICD-10-CM | POA: Diagnosis not present

## 2023-08-18 DIAGNOSIS — I071 Rheumatic tricuspid insufficiency: Secondary | ICD-10-CM | POA: Diagnosis not present

## 2023-08-18 DIAGNOSIS — C4359 Malignant melanoma of other part of trunk: Secondary | ICD-10-CM | POA: Diagnosis not present

## 2023-08-18 DIAGNOSIS — I340 Nonrheumatic mitral (valve) insufficiency: Secondary | ICD-10-CM | POA: Diagnosis not present

## 2023-08-18 DIAGNOSIS — I272 Pulmonary hypertension, unspecified: Secondary | ICD-10-CM | POA: Diagnosis not present

## 2023-08-18 DIAGNOSIS — Z79899 Other long term (current) drug therapy: Secondary | ICD-10-CM | POA: Diagnosis not present

## 2023-08-19 DIAGNOSIS — R948 Abnormal results of function studies of other organs and systems: Secondary | ICD-10-CM | POA: Diagnosis not present

## 2023-08-19 DIAGNOSIS — Z885 Allergy status to narcotic agent status: Secondary | ICD-10-CM | POA: Diagnosis not present

## 2023-08-19 DIAGNOSIS — D123 Benign neoplasm of transverse colon: Secondary | ICD-10-CM | POA: Diagnosis not present

## 2023-08-19 DIAGNOSIS — R933 Abnormal findings on diagnostic imaging of other parts of digestive tract: Secondary | ICD-10-CM | POA: Diagnosis not present

## 2023-08-19 DIAGNOSIS — C7801 Secondary malignant neoplasm of right lung: Secondary | ICD-10-CM | POA: Diagnosis not present

## 2023-08-19 DIAGNOSIS — K635 Polyp of colon: Secondary | ICD-10-CM | POA: Diagnosis not present

## 2023-08-19 DIAGNOSIS — K640 First degree hemorrhoids: Secondary | ICD-10-CM | POA: Diagnosis not present

## 2023-08-19 DIAGNOSIS — K5730 Diverticulosis of large intestine without perforation or abscess without bleeding: Secondary | ICD-10-CM | POA: Diagnosis not present

## 2023-08-19 DIAGNOSIS — Z1211 Encounter for screening for malignant neoplasm of colon: Secondary | ICD-10-CM | POA: Diagnosis not present

## 2023-08-19 DIAGNOSIS — Z87891 Personal history of nicotine dependence: Secondary | ICD-10-CM | POA: Diagnosis not present

## 2023-08-19 DIAGNOSIS — Z79899 Other long term (current) drug therapy: Secondary | ICD-10-CM | POA: Diagnosis not present

## 2023-08-19 DIAGNOSIS — I10 Essential (primary) hypertension: Secondary | ICD-10-CM | POA: Diagnosis not present

## 2023-08-28 DIAGNOSIS — M549 Dorsalgia, unspecified: Secondary | ICD-10-CM | POA: Diagnosis not present

## 2023-08-28 DIAGNOSIS — Z79899 Other long term (current) drug therapy: Secondary | ICD-10-CM | POA: Diagnosis not present

## 2023-08-28 DIAGNOSIS — T451X5A Adverse effect of antineoplastic and immunosuppressive drugs, initial encounter: Secondary | ICD-10-CM | POA: Diagnosis not present

## 2023-08-28 DIAGNOSIS — Z791 Long term (current) use of non-steroidal anti-inflammatories (NSAID): Secondary | ICD-10-CM | POA: Diagnosis not present

## 2023-08-28 DIAGNOSIS — L089 Local infection of the skin and subcutaneous tissue, unspecified: Secondary | ICD-10-CM | POA: Diagnosis not present

## 2023-08-28 DIAGNOSIS — E0590 Thyrotoxicosis, unspecified without thyrotoxic crisis or storm: Secondary | ICD-10-CM | POA: Diagnosis not present

## 2023-08-28 DIAGNOSIS — Z5112 Encounter for antineoplastic immunotherapy: Secondary | ICD-10-CM | POA: Diagnosis not present

## 2023-08-28 DIAGNOSIS — R21 Rash and other nonspecific skin eruption: Secondary | ICD-10-CM | POA: Diagnosis not present

## 2023-08-28 DIAGNOSIS — L905 Scar conditions and fibrosis of skin: Secondary | ICD-10-CM | POA: Diagnosis not present

## 2023-08-28 DIAGNOSIS — R221 Localized swelling, mass and lump, neck: Secondary | ICD-10-CM | POA: Diagnosis not present

## 2023-08-28 DIAGNOSIS — C4359 Malignant melanoma of other part of trunk: Secondary | ICD-10-CM | POA: Diagnosis not present

## 2023-08-28 DIAGNOSIS — C7801 Secondary malignant neoplasm of right lung: Secondary | ICD-10-CM | POA: Diagnosis not present

## 2023-08-28 DIAGNOSIS — C779 Secondary and unspecified malignant neoplasm of lymph node, unspecified: Secondary | ICD-10-CM | POA: Diagnosis not present

## 2023-09-18 DIAGNOSIS — C7801 Secondary malignant neoplasm of right lung: Secondary | ICD-10-CM | POA: Diagnosis not present

## 2023-09-18 DIAGNOSIS — M5441 Lumbago with sciatica, right side: Secondary | ICD-10-CM | POA: Diagnosis not present

## 2023-09-18 DIAGNOSIS — G8929 Other chronic pain: Secondary | ICD-10-CM | POA: Diagnosis not present

## 2023-09-18 DIAGNOSIS — C4359 Malignant melanoma of other part of trunk: Secondary | ICD-10-CM | POA: Diagnosis not present

## 2023-09-18 DIAGNOSIS — Z79899 Other long term (current) drug therapy: Secondary | ICD-10-CM | POA: Diagnosis not present

## 2023-09-18 DIAGNOSIS — E0590 Thyrotoxicosis, unspecified without thyrotoxic crisis or storm: Secondary | ICD-10-CM | POA: Diagnosis not present

## 2023-09-18 DIAGNOSIS — Z5112 Encounter for antineoplastic immunotherapy: Secondary | ICD-10-CM | POA: Diagnosis not present

## 2023-09-19 DIAGNOSIS — E0590 Thyrotoxicosis, unspecified without thyrotoxic crisis or storm: Secondary | ICD-10-CM | POA: Diagnosis not present

## 2023-09-23 DIAGNOSIS — Z0000 Encounter for general adult medical examination without abnormal findings: Secondary | ICD-10-CM | POA: Diagnosis not present

## 2023-09-23 DIAGNOSIS — H01021 Squamous blepharitis right upper eyelid: Secondary | ICD-10-CM | POA: Diagnosis not present

## 2023-09-23 DIAGNOSIS — C438 Malignant melanoma of overlapping sites of skin: Secondary | ICD-10-CM | POA: Diagnosis not present

## 2023-09-23 DIAGNOSIS — Z23 Encounter for immunization: Secondary | ICD-10-CM | POA: Diagnosis not present

## 2023-09-23 DIAGNOSIS — D23122 Other benign neoplasm of skin of left lower eyelid, including canthus: Secondary | ICD-10-CM | POA: Diagnosis not present

## 2023-09-23 DIAGNOSIS — I10 Essential (primary) hypertension: Secondary | ICD-10-CM | POA: Diagnosis not present

## 2023-09-23 DIAGNOSIS — D2310 Other benign neoplasm of skin of unspecified eyelid, including canthus: Secondary | ICD-10-CM | POA: Diagnosis not present

## 2023-09-23 DIAGNOSIS — E0590 Thyrotoxicosis, unspecified without thyrotoxic crisis or storm: Secondary | ICD-10-CM | POA: Diagnosis not present

## 2023-09-23 DIAGNOSIS — M545 Low back pain, unspecified: Secondary | ICD-10-CM | POA: Diagnosis not present

## 2023-09-23 DIAGNOSIS — Z79899 Other long term (current) drug therapy: Secondary | ICD-10-CM | POA: Diagnosis not present

## 2023-09-23 DIAGNOSIS — Z131 Encounter for screening for diabetes mellitus: Secondary | ICD-10-CM | POA: Diagnosis not present

## 2023-10-03 DIAGNOSIS — C4359 Malignant melanoma of other part of trunk: Secondary | ICD-10-CM | POA: Diagnosis not present

## 2023-10-03 DIAGNOSIS — Z79899 Other long term (current) drug therapy: Secondary | ICD-10-CM | POA: Diagnosis not present

## 2023-10-06 DIAGNOSIS — T45AX5A Adverse effect of immune checkpoint inhibitors and immunostimulant drugs, initial encounter: Secondary | ICD-10-CM | POA: Diagnosis not present

## 2023-10-06 DIAGNOSIS — C4359 Malignant melanoma of other part of trunk: Secondary | ICD-10-CM | POA: Diagnosis not present

## 2023-10-06 DIAGNOSIS — C7801 Secondary malignant neoplasm of right lung: Secondary | ICD-10-CM | POA: Diagnosis not present

## 2023-10-06 DIAGNOSIS — Z79899 Other long term (current) drug therapy: Secondary | ICD-10-CM | POA: Diagnosis not present

## 2023-10-09 DIAGNOSIS — Z79899 Other long term (current) drug therapy: Secondary | ICD-10-CM | POA: Diagnosis not present

## 2023-10-09 DIAGNOSIS — C4359 Malignant melanoma of other part of trunk: Secondary | ICD-10-CM | POA: Diagnosis not present

## 2023-10-16 DIAGNOSIS — C4359 Malignant melanoma of other part of trunk: Secondary | ICD-10-CM | POA: Diagnosis not present

## 2023-10-16 DIAGNOSIS — Z79899 Other long term (current) drug therapy: Secondary | ICD-10-CM | POA: Diagnosis not present

## 2023-10-20 DIAGNOSIS — Z79899 Other long term (current) drug therapy: Secondary | ICD-10-CM | POA: Diagnosis not present

## 2023-10-20 DIAGNOSIS — C4359 Malignant melanoma of other part of trunk: Secondary | ICD-10-CM | POA: Diagnosis not present

## 2023-10-27 DIAGNOSIS — C4359 Malignant melanoma of other part of trunk: Secondary | ICD-10-CM | POA: Diagnosis not present

## 2023-10-27 DIAGNOSIS — Z79899 Other long term (current) drug therapy: Secondary | ICD-10-CM | POA: Diagnosis not present

## 2023-10-31 DIAGNOSIS — H5203 Hypermetropia, bilateral: Secondary | ICD-10-CM | POA: Diagnosis not present

## 2023-10-31 DIAGNOSIS — H52203 Unspecified astigmatism, bilateral: Secondary | ICD-10-CM | POA: Diagnosis not present

## 2023-10-31 DIAGNOSIS — H524 Presbyopia: Secondary | ICD-10-CM | POA: Diagnosis not present

## 2023-10-31 DIAGNOSIS — H01009 Unspecified blepharitis unspecified eye, unspecified eyelid: Secondary | ICD-10-CM | POA: Diagnosis not present

## 2023-10-31 DIAGNOSIS — H2513 Age-related nuclear cataract, bilateral: Secondary | ICD-10-CM | POA: Diagnosis not present

## 2023-10-31 DIAGNOSIS — C7801 Secondary malignant neoplasm of right lung: Secondary | ICD-10-CM | POA: Diagnosis not present

## 2023-11-04 DIAGNOSIS — R21 Rash and other nonspecific skin eruption: Secondary | ICD-10-CM | POA: Diagnosis not present

## 2023-11-04 DIAGNOSIS — E0590 Thyrotoxicosis, unspecified without thyrotoxic crisis or storm: Secondary | ICD-10-CM | POA: Diagnosis not present

## 2023-11-04 DIAGNOSIS — M5441 Lumbago with sciatica, right side: Secondary | ICD-10-CM | POA: Diagnosis not present

## 2023-11-04 DIAGNOSIS — Z9226 Personal history of immune checkpoint inhibitor therapy: Secondary | ICD-10-CM | POA: Diagnosis not present

## 2023-11-04 DIAGNOSIS — M7592 Shoulder lesion, unspecified, left shoulder: Secondary | ICD-10-CM | POA: Diagnosis not present

## 2023-11-04 DIAGNOSIS — C770 Secondary and unspecified malignant neoplasm of lymph nodes of head, face and neck: Secondary | ICD-10-CM | POA: Diagnosis not present

## 2023-11-04 DIAGNOSIS — Z79899 Other long term (current) drug therapy: Secondary | ICD-10-CM | POA: Diagnosis not present

## 2023-11-04 DIAGNOSIS — C4359 Malignant melanoma of other part of trunk: Secondary | ICD-10-CM | POA: Diagnosis not present

## 2023-11-04 DIAGNOSIS — K759 Inflammatory liver disease, unspecified: Secondary | ICD-10-CM | POA: Diagnosis not present

## 2023-11-04 DIAGNOSIS — C7801 Secondary malignant neoplasm of right lung: Secondary | ICD-10-CM | POA: Diagnosis not present

## 2023-11-04 DIAGNOSIS — L80 Vitiligo: Secondary | ICD-10-CM | POA: Diagnosis not present

## 2023-11-04 NOTE — Progress Notes (Signed)
 Outpatient Clinic note        Melanoma Cancer Reassessment:  Referring Physician: Gabriella Cathlean Caldron, Md [address] [place],  [place] [address]. 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 Two lesions remain in the left supraclavicular area and slight dz in the axilla and hilum. - Order PET scan in 14 weeks to monitor lesions. - Balanced discussion about close fu versus maint nivolumab  and the pt wishes to have maint nivolumab . I pointed out that he did not have a good response to the nivo prior to the ipi but his immune syst has changed and he would really prefer Rx.  - Plan for two rounds of nivolumab  if PET scan shows additional concerns. Begin 6 weeks from now to ensure full recovering from the irAE hepatitis.  - Ensure he is off prednisone before starting nivolumab .  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 Liver function tests normal. Prednisone tapering ongoing. - Continue prednisone taper: 20 mg for 4 days, then 10 mg for 4 days, then stop. - Order liver function tests in 2 weeks.  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 Symptoms include occasional hyperactivity and anxiety. - Order thyroid  function tests. - Consider discontinuing atenolol if thyroid  function is normal or low.  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 Symptoms include constipation, occasional diarrhea, gas, and bloating. - Continue famotidine for gastrointestinal symptoms. - Recommend simethicone for gas relief. - Consider GI evaluation if symptoms persist.  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  Back pain with right-sided sciatica, improved Back pain with right-sided sciatica improved with daily walking. MRI scheduled to rule out cancer involvement and confirm arthritis. - Proceed with MRI on 10/28 to evaluate back pain.    The pt is on high risk medication, ICI for his cancer and as such I am monitoring the CBC, CMP and periodically TSH and T4. Patients who take ICI are at risk for immune related toxicity that can affect any organ in the body which I assess in part by monitoring these labs.  Hematology/Oncology History Overview Note  July 07, 2020: Melanoma of LU back. September 11, 2020 Dr. Debora performed wide local excision left upper back with sentinel node biopsy ---+ nodes.  Staged as T3aN1aMo IIIB An immunohistochemical stain for detection of BRAF V600E mutant protein is positive in melanoma cells.   Sept 23 seen by Dr Kennedy for melanoma systemic rx.  After a balanced discussion, the pt chose observation over adjuvant Rx.  June 2024 The pt was diagnosed with recurrent dz on the scar of the left upper back Following 6 cycles of TVEC PET scan shows suspicious disease in the neck .  Path of the resected dz showed 10% viable tumor cells (near path CR) PET scan showed several small nodes in the neck, axilla and a new nodule in the lung. After a balanced discussion, the pt elected close observation with follow up PET scan.  March 2025 new dz in the left scapular area, confirmed on PET  Balanced discussions. Pt decided to take nivolumab . May 2025: Nivolumab  monotherapy July 2025: Received Nivo x3. PET shows PD- new avid left infraspinatus nodule and  marked uptake in the colon along hepatic flexure. Discussed BRAF/MEKi Enco/Bini August 14 2023:  Pt decided to take ipi  and nivo (8/14 and 9/4. Dose ipi 1mg /kg and nivo 3mg /kg) Sept 22: irAE hyperthyroid, soft stool and hepatitis. Prednisone  taper       Malignant melanoma of upper back    (CMS-HCC)  11/28/2020 Initial Diagnosis   Malignant melanoma of upper back (CMS-HCC)   11/28/2020 -  Cancer Staged   Staging form: Melanoma of the Skin, AJCC 8th Edition - Pathologic stage from 11/28/2020: Stage IIIB (pT3a, pN1a, cM0) - Signed by Ozell MARLA Close, MD on 11/28/2020    05/06/2023 -  Cancer Staged   Staging form: Melanoma of the Skin, AJCC 8th Edition - Pathologic stage from 05/06/2023: Stage IV (rpT3a, pN3c, cM1b(0)) - Signed by Collichio, Cathlean Caldron, MD on 05/06/2023        History of Present Illness Christian Oneal is a 73 year old male with stage four melanoma who presents for follow-up.  He is undergoing treatment with ipilimumab at one milligram per kilogram and nivolumab  at three milligrams per kilogram, with the most recent dose administered on September 4th. Following this treatment, he developed hyperthyroidism and hepatitis, for which he has been on prednisone. His current prednisone dose is 30 mg daily.  He experiences gastrointestinal symptoms, including frequent gas and constipation. Bowel movements occur every two days since starting prednisone, compared to daily before. He is currently taking atenolol, famotidine, prednisone, vitamin C, vitamin D, and magnesium. He has simethicone at home, which he has not yet tried.  He reports occasional feelings of being 'souped up and hyper' due to his overactive thyroid , with some associated anxiety. He notes changes in his physical appearance, including graying of the eyebrows and hair, and patches of white on his skin.  He mentions a palpable mass above his collarbone, which he feels is breaking up into smaller parts. He has been experiencing back pain, which has improved with daily walking. An MRI is scheduled for October 28th to evaluate the back pain further.  His urine has been darker than usual. He mentioned drinking a lot of water because he was fasting for labs.  He plans to provide a urine sample for further evaluation.     ECOG PS: 1   Problem List:  Problem List[1]  Medications: Current Medications[2]  Personal and Social History:  Social History   Social History Narrative  . Not on file    Family History[3]  Review of Systems: A complete review of systems was obtained including: Constitutional, Eyes, ENT, Cardiovascular, Respiratory, GI, GU, Musculoskeletal, Skin, Neurological, Psychiatric, Endocrine, Heme/Lymphatic, and Allergic/Immunologic systems. All other systems reviewed and are negative to the patient's management except for what was mentioned in the interim history.    Physical Exam NECK: Left supraclavicular node palpable, hard, possibly calcified. THere are two small nodes there. Lungs are clear WHiter hair noted.       Results LABS Liver function tests: Normal (11/04/2023) Kidney function: Normal (11/04/2023)  RADIOLOGY PET scan: Favorable treatment response compared to July; decrease in size of left axillary and pulmonary lesions; no evidence of metastatic disease in lungs; uptake in colon region consistent with previous colonoscopy findings of hemorrhoids, diverticula, polyps (11/03/2023)     I reviewed the images and my interpretation is no lung mets, faint node in the axilla and hilum, dz noted in the supraclavicular area    AI technology was used to create visit note. Verbal consent from the  patient/caregiver was obtained prior to its use. Portions of this note may be dictated using voice recognition software. Variances in spelling and vocabulary are possible and unintentional. Not all errors are caught/corrected.            [1] Patient Active Problem List Diagnosis  . Malignant melanoma of upper back    (CMS-HCC)  . Encounter for screening for other viral diseases  . Autoimmune thyroiditis  . 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 High risk medication use  [2] Current Outpatient Medications   Medication Sig Dispense Refill  . ascorbic acid, vitamin C, (VITAMIN C) 1000 MG tablet Take 1 tablet (1,000 mg total) by mouth.    SABRA atenolol (TENORMIN) 25 MG tablet Take 1 tablet (25 mg total) by mouth daily. 30 tablet 11  . cholecalciferol, vitamin D3-50 mcg, 2,000 unit,, 50 mcg (2,000 unit) cap Take by mouth.    . clobetasol (TEMOVATE) 0.05 % external solution Apply topically three (3) times a day. To Scalp    . coenzyme Q10 200 mg capsule Take 1 capsule (200 mg total) by mouth in the morning.    . diclofenac sodium (VOLTAREN) 1 % gel Apply 2 g topically four (4) times a day. 50 g 11  . famotidine (PEPCID) 20 MG tablet TAKE 1 TABLET BY MOUTH TWO TIMES A DAY. 180 tablet 1  . lidocaine -prilocaine (EMLA) 2.5-2.5 % cream Apply to lesions undergoing injection 30 minutes prior to treatment. (Patient not taking: Reported on 10/31/2023) 30 g 2  . magnesium oxide 300 mg magnesium Tab Take 300 mg by mouth in the morning.    . metroNIDAZOLE (METROCREAM) 0.75 % cream     . naproxen (NAPROSYN) 250 MG tablet Take 1 tablet (250 mg total) by mouth in the morning and 1 tablet (250 mg total) in the evening. Take with meals. Do all this for 365 doses. 30 tablet 6  . neomycin-polymyxin-dexamethamethasone (MAXITROL) 3.5 mg/g-10,000 unit/g-0.1 % Oint Administer 1 Application to both eyes every twelve (12) hours for 14 days. 3.5 g 0  . olive oil (SWEET OIL) Oil Take 30 mL by mouth in the morning.    . predniSONE (DELTASONE) 20 MG tablet Take 3 tablets (60 mg total) by mouth daily for 7 days, THEN 2.5 tablets (50 mg total) daily for 7 days, THEN 2 tablets (40 mg total) daily for 7 days, THEN 1.5 tablets (30 mg total) daily for 7 days, THEN 1 tablet (20 mg total) daily for 7 days, THEN 0.5 tablets (10 mg total) daily for 7 days. 74 tablet 0  . prochlorperazine (COMPAZINE) 10 MG tablet Take 1 tablet (10 mg total) by mouth every six (6) hours as needed (Nausea/Vomiting). 30 tablet 2  . triamcinolone (KENALOG) 0.1 % cream      . UNABLE TO FIND in the morning. [employer] [facility].    SABRA UNABLE TO FIND Take 3 capsules by mouth in the morning. Med Name: total restore- gut health.    . valsartan (DIOVAN) 160 MG tablet      No current facility-administered medications for this visit.  [3] Family History Problem Relation Age of Onset  . Heart attack Father

## 2023-11-11 DIAGNOSIS — M47817 Spondylosis without myelopathy or radiculopathy, lumbosacral region: Secondary | ICD-10-CM | POA: Diagnosis not present

## 2023-11-11 DIAGNOSIS — C7801 Secondary malignant neoplasm of right lung: Secondary | ICD-10-CM | POA: Diagnosis not present

## 2023-11-11 DIAGNOSIS — M48061 Spinal stenosis, lumbar region without neurogenic claudication: Secondary | ICD-10-CM | POA: Diagnosis not present

## 2023-11-11 DIAGNOSIS — M4317 Spondylolisthesis, lumbosacral region: Secondary | ICD-10-CM | POA: Diagnosis not present

## 2023-11-11 DIAGNOSIS — C439 Malignant melanoma of skin, unspecified: Secondary | ICD-10-CM | POA: Diagnosis not present

## 2023-11-19 DIAGNOSIS — Z79899 Other long term (current) drug therapy: Secondary | ICD-10-CM | POA: Diagnosis not present

## 2023-11-19 DIAGNOSIS — C4359 Malignant melanoma of other part of trunk: Secondary | ICD-10-CM | POA: Diagnosis not present

## 2023-11-25 NOTE — Progress Notes (Signed)
 Please also see the MyChart messages.  Christian Oneal and I had a long talk.  First, he clarified that the starbursts are not on his face but rather a visual experience.  I told him that such flashes are not uncommon and to let me know if they become more frequent.  His red facial splotches are not getting better despite trying the Metrocream for rosacea. I said that perhaps the rahs was there and being masked by the prednisone he was taking.  A mole on his back near his left shoulder is darker in color.  I thus strongly encouraged him to see his derm this month.  Finally, he is on the fence as to whether to cont with Opdivo . I told him to give it more thought and that we would do whatever he prefers.

## 2023-11-26 DIAGNOSIS — L719 Rosacea, unspecified: Secondary | ICD-10-CM | POA: Diagnosis not present

## 2023-11-26 DIAGNOSIS — D485 Neoplasm of uncertain behavior of skin: Secondary | ICD-10-CM | POA: Diagnosis not present

## 2023-11-26 DIAGNOSIS — L820 Inflamed seborrheic keratosis: Secondary | ICD-10-CM | POA: Diagnosis not present

## 2023-11-26 DIAGNOSIS — L821 Other seborrheic keratosis: Secondary | ICD-10-CM | POA: Diagnosis not present

## 2023-11-30 ENCOUNTER — Ambulatory Visit
Admission: EM | Admit: 2023-11-30 | Discharge: 2023-11-30 | Disposition: A | Discharge status: Home / Self Care | Attending: Emergency Medicine | Admitting: Emergency Medicine

## 2023-11-30 DIAGNOSIS — R197 Diarrhea, unspecified: Secondary | ICD-10-CM | POA: Diagnosis not present

## 2023-11-30 DIAGNOSIS — R110 Nausea: Secondary | ICD-10-CM | POA: Diagnosis not present

## 2023-11-30 HISTORY — DX: Malignant (primary) neoplasm, unspecified: C80.1

## 2023-11-30 HISTORY — DX: Essential (primary) hypertension: I10

## 2023-11-30 MED ORDER — ONDANSETRON 4 MG PO TBDP: 4.0000 mg | ORAL_TABLET | Freq: Three times a day (TID) | ORAL | 0 refills | Status: AC | PRN

## 2023-11-30 NOTE — ED Triage Notes (Signed)
 Patient to Urgent Care with complaints of  stomach cramping and diarrhea. States food goes straight through me.  Symptoms started yesterday morning. No sick contacts.

## 2023-11-30 NOTE — ED Provider Notes (Signed)
 Christian Oneal    CSN: [id number] Arrival date & time: 11/30/23  0805      History   Chief Complaint Chief Complaint  Patient presents with   Diarrhea    HPI Julio Zappia is a 73 y.o. male.  Patient presents with nausea, diarrhea, stomach cramping x 1 day.  He reports 2 episodes of diarrhea this morning; approximately 10 episodes yesterday.  No fever or vomiting.  No recent travel out of the country or antibiotic use.  No sick contacts at home.  Patient has taken Pepto-Bismol for his symptoms and reports modest improvement.  He has promethazine at home which was previously prescribed as he is a cancer patient but he has not taken this.  He states he is able to keep himself orally hydrated.  The history is provided by the patient and medical records.    Past Medical History:  Diagnosis Date   Cancer (HCC)    Hypertension     There are no active problems to display for this patient.   Past Surgical History:  Procedure Laterality Date   CHOLECYSTECTOMY  1980   HERNIA REPAIR  1990       Home Medications    Prior to Admission medications   Medication Sig Start Date End Date Taking? Authorizing Provider  ascorbic acid (VITAMIN C) 1000 MG tablet Take 1,000 mg by mouth. 08/08/20  Yes [provider]  Cholecalciferol 50 MCG (2000 UT) CAPS Take by mouth. 08/08/20  Yes [provider]  ondansetron  (ZOFRAN -ODT) 4 MG disintegrating tablet Take 1 tablet (4 mg total) by mouth every 8 (eight) hours as needed for nausea or vomiting. 11/30/23  Yes Corlis Burnard DEL, NP  predniSONE (DELTASONE) 20 MG tablet Take by mouth. 10/06/23  Yes [provider]  valsartan (DIOVAN) 160 MG tablet  02/20/23  Yes [provider]  Magnesium Oxide -Mg Supplement (TRUE MAGNESIUM OXIDE) 500 MG TABS Take 300 mg by mouth.    [provider]    Family History Family History  Problem Relation Age of Onset   Hypertension Father    Alcohol abuse Father      Social History Social History   Tobacco Use   Smoking status: Never   Smokeless tobacco: Never  Vaping Use   Vaping status: Never Used  Substance Use Topics   Alcohol use: No   Drug use: No     Allergies   Codeine   Review of Systems Review of Systems  Constitutional:  Negative for chills and fever.  Gastrointestinal:  Positive for abdominal pain, diarrhea and nausea. Negative for blood in stool and vomiting.     Physical Exam Triage Vital Signs ED Triage Vitals  Encounter Vitals Group     BP      Girls Systolic BP Percentile      Girls Diastolic BP Percentile      Boys Systolic BP Percentile      Boys Diastolic BP Percentile      Pulse      Resp      Temp      Temp src      SpO2      Weight      Height      Head Circumference      Peak Flow      Pain Score      Pain Loc      Pain Education      Exclude from Growth Chart    No data  found.  Updated Vital Signs BP (!) 150/82   Pulse 96   Temp (!) 97.2 F (36.2 C)   Resp 19   SpO2 98%   Visual Acuity Right Eye Distance:   Left Eye Distance:   Bilateral Distance:    Right Eye Near:   Left Eye Near:    Bilateral Near:     Physical Exam Constitutional:      General: He is not in acute distress. HENT:     Mouth/Throat:     Mouth: Mucous membranes are moist.  Cardiovascular:     Rate and Rhythm: Normal rate and regular rhythm.     Heart sounds: Normal heart sounds.  Pulmonary:     Effort: Pulmonary effort is normal. No respiratory distress.     Breath sounds: Normal breath sounds.  Abdominal:     General: Bowel sounds are normal.     Palpations: Abdomen is soft.     Tenderness: There is no abdominal tenderness. There is no guarding or rebound.  Neurological:     Mental Status: He is alert.      UC Treatments / Results  Labs (all labs ordered are listed, but only abnormal results are displayed) Labs Reviewed - No data to display  EKG   Radiology No results  found.  Procedures Procedures (including critical care time)  Medications Ordered in UC Medications - No data to display  Initial Impression / Assessment and Plan / UC Course  I have reviewed the triage vital signs and the nursing notes.  Pertinent labs & imaging results that were available during my care of the patient were reviewed by me and considered in my medical decision making (see chart for details).   Nausea without vomiting, diarrhea.  Afebrile and vital signs are stable.  Treating nausea with Zofran .  Discussed clear liquid diet; advance to diarrhea diet as tolerated.  Discussed maintaining oral hydration at home; ED precautions discussed.  Education provided on nausea, diarrhea.  Instructed patient to follow-up with his PCP tomorrow.  He agrees to plan of care.   Final Clinical Impressions(s) / UC Diagnoses   Final diagnoses:  Nausea without vomiting  Diarrhea, unspecified type     Discharge Instructions      Take the antinausea medication as directed.    Keep yourself hydrated with clear liquids, such as water.  Follow the diarrhea diet as tolerated.   Go to the emergency department if you have worsening symptoms.    Follow up with your primary care provider tomorrow.          ED Prescriptions     Medication Sig Dispense Auth. Provider   ondansetron  (ZOFRAN -ODT) 4 MG disintegrating tablet Take 1 tablet (4 mg total) by mouth every 8 (eight) hours as needed for nausea or vomiting. 20 tablet Corlis Burnard DEL, NP      PDMP not reviewed this encounter.   Corlis Burnard DEL, NP 11/30/23 [phone]

## 2023-11-30 NOTE — Discharge Instructions (Addendum)
 Take the antinausea medication as directed.    Keep yourself hydrated with clear liquids, such as water.  Follow the diarrhea diet as tolerated.   Go to the emergency department if you have worsening symptoms.    Follow up with your primary care provider tomorrow.

## 2023-12-16 DIAGNOSIS — C4359 Malignant melanoma of other part of trunk: Secondary | ICD-10-CM | POA: Diagnosis not present

## 2023-12-16 DIAGNOSIS — C7801 Secondary malignant neoplasm of right lung: Secondary | ICD-10-CM | POA: Diagnosis not present

## 2023-12-16 DIAGNOSIS — E0590 Thyrotoxicosis, unspecified without thyrotoxic crisis or storm: Secondary | ICD-10-CM | POA: Diagnosis not present

## 2023-12-16 DIAGNOSIS — K649 Unspecified hemorrhoids: Secondary | ICD-10-CM | POA: Diagnosis not present

## 2023-12-16 DIAGNOSIS — R109 Unspecified abdominal pain: Secondary | ICD-10-CM | POA: Diagnosis not present

## 2023-12-16 DIAGNOSIS — Z79899 Other long term (current) drug therapy: Secondary | ICD-10-CM | POA: Diagnosis not present

## 2023-12-16 DIAGNOSIS — R197 Diarrhea, unspecified: Secondary | ICD-10-CM | POA: Diagnosis not present
